# Patient Record
Sex: Female | Born: 1990 | Race: Black or African American | Hispanic: No | Marital: Single | Smoking: Never smoker
Health system: Southern US, Community
[De-identification: ages and names within clinical notes are randomized; demographics above are authoritative.]

---
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
## 2011-01-14 LAB — CBC
HCT: 28.6 % — ABNORMAL LOW (ref 36.0–46.0)
HCT: 34.8 % — ABNORMAL LOW (ref 36.0–46.0)
Hemoglobin: 11.5 g/dL — ABNORMAL LOW (ref 12.0–15.0)
Hemoglobin: 9.4 g/dL — ABNORMAL LOW (ref 12.0–15.0)
MCHC: 32.8 g/dL (ref 30.0–36.0)
MCHC: 33.1 g/dL (ref 30.0–36.0)
MCV: 86.2 fL (ref 78.0–100.0)
MCV: 87.2 fL (ref 78.0–100.0)
Platelets: 196 K/uL (ref 150–400)
Platelets: 224 K/uL (ref 150–400)
RBC: 3.28 MIL/uL — ABNORMAL LOW (ref 3.87–5.11)
RBC: 4.04 MIL/uL (ref 3.87–5.11)
RDW: 15.3 % (ref 11.5–15.5)
RDW: 15.7 % — ABNORMAL HIGH (ref 11.5–15.5)
WBC: 14.8 K/uL — ABNORMAL HIGH (ref 4.0–10.5)
WBC: 8.1 K/uL (ref 4.0–10.5)

## 2011-01-14 LAB — SYPHILIS: RPR W/REFLEX TO RPR TITER AND TREPONEMAL ANTIBODIES, TRADITIONAL SCREENING AND DIAGNOSIS ALGORITHM: RPR Ser Ql: NONREACTIVE

## 2011-01-30 ENCOUNTER — Emergency Department (HOSPITAL_COMMUNITY)
Admission: EM | Admit: 2011-01-30 | Discharge: 2011-01-30 | Disposition: A | Discharge status: Home / Self Care | Payer: Self-pay | Attending: Emergency Medicine | Admitting: Emergency Medicine

## 2011-01-30 DIAGNOSIS — N61 Mastitis without abscess: Secondary | ICD-10-CM | POA: Insufficient documentation

## 2011-03-02 ENCOUNTER — Emergency Department (HOSPITAL_COMMUNITY)
Admission: EM | Admit: 2011-03-02 | Discharge: 2011-03-02 | Disposition: A | Discharge status: Home / Self Care | Payer: Self-pay | Attending: Emergency Medicine | Admitting: Emergency Medicine

## 2011-03-02 DIAGNOSIS — R21 Rash and other nonspecific skin eruption: Secondary | ICD-10-CM | POA: Insufficient documentation

## 2011-12-09 ENCOUNTER — Encounter (HOSPITAL_COMMUNITY): Payer: Self-pay | Admitting: Adult Health

## 2011-12-09 ENCOUNTER — Emergency Department (HOSPITAL_COMMUNITY)
Admission: EM | Admit: 2011-12-09 | Discharge: 2011-12-09 | Disposition: A | Discharge status: Home / Self Care | Payer: Self-pay | Attending: Emergency Medicine | Admitting: Emergency Medicine

## 2011-12-09 DIAGNOSIS — L509 Urticaria, unspecified: Secondary | ICD-10-CM | POA: Insufficient documentation

## 2011-12-09 MED ORDER — DIPHENHYDRAMINE HCL 25 MG PO CAPS: 25.0000 mg | ORAL_CAPSULE | Freq: Four times a day (QID) | ORAL | Status: DC | PRN

## 2011-12-09 MED ORDER — PREDNISONE 20 MG PO TABS
60.0000 mg | ORAL_TABLET | Freq: Once | ORAL | Status: AC
  Administered 2011-12-09: 60 mg via ORAL
  Filled 2011-12-09: qty 3

## 2011-12-09 MED ORDER — FAMOTIDINE 20 MG PO TABS
20.0000 mg | ORAL_TABLET | Freq: Once | ORAL | Status: AC
  Administered 2011-12-09: 20 mg via ORAL
  Filled 2011-12-09: qty 1

## 2011-12-09 MED ORDER — DIPHENHYDRAMINE HCL 25 MG PO CAPS
50.0000 mg | ORAL_CAPSULE | Freq: Once | ORAL | Status: AC
  Administered 2011-12-09: 50 mg via ORAL
  Filled 2011-12-09: qty 2

## 2011-12-09 MED ORDER — FAMOTIDINE 20 MG PO TABS: 20.0000 mg | ORAL_TABLET | Freq: Two times a day (BID) | ORAL | Status: DC

## 2011-12-09 NOTE — ED Notes (Signed)
Urticaria that began this evening noted to hands and trunk bilaterally. C/o itching

## 2011-12-09 NOTE — Discharge Instructions (Signed)
Continue to take benadryl and pepcid as prescribed for reaction. Take a shower when you get home. Follow up if not improving. Return to ER if having swelling to lips, tongue, difficulty breathing.  Allergic Reaction Allergic reactions can be caused by anything your body is sensitive to. Your body may be sensitive to food, medicines, molds, pollens, cockroaches, dust mites, pets, insect stings, and other things around you. An allergic reaction may cause puffiness (swelling), itching, sneezing, coughing, or problems breathing.  Allergies cannot be cured, but they can be controlled with medicine. Some allergies happen only at certain times of the year. Try to stay away from what causes your reaction if possible. Sometimes, it is hard to tell what causes your reaction. HOME CARE If you have a rash or red patches (hives) on your skin:  Take medicines as told by your doctor.   Do not drive or drink alcohol after taking medicines. They can make you sleepy.   Put cold cloths on your skin. Take baths in cool water. This will help your itching. Do not take hot baths or showers. Heat will make the itching worse.   If your allergies get worse, your doctor might give you other medicines. Talk to your doctor if problems continue.  GET HELP RIGHT AWAY IF:   You have trouble breathing.   You have a tight feeling in your chest or throat.   Your mouth gets puffy (swollen).   You have red, itchy patches on your skin (hives) that get worse.   You have itching all over your body.  MAKE SURE YOU:   Understand these instructions.   Will watch your condition.   Will get help right away if you are not doing well or get worse.  Document Released: 09/16/2009 Document Revised: 06/10/2011 Document Reviewed: 09/16/2009 [facility] Patient Information 2012 [place], [place].

## 2011-12-09 NOTE — ED Provider Notes (Signed)
History     CSN: [id number]  Arrival date & time 12/09/11  1814   First MD Initiated Contact with Patient 12/09/11 1831      Chief Complaint  Patient presents with  . Pruritis  . Urticaria    (Consider location/radiation/quality/duration/timing/severity/associated sxs/prior treatment) Patient is a 21 y.o. female presenting with urticaria. The history is provided by the patient.  Urticaria This is a new problem. The current episode started today. Associated symptoms include a rash. Pertinent negatives include no chills or fever.  Pt states she just got to work and began having hives all over the body. Denies lip or throat swelling. Denies SOB. States works at [employer] with lots of chemicals. Unsure what she came in contact with. Denies new soaps, detergent, personal products. Denies new foods. States she did not eat anything today. Did not take any medications.   History reviewed. No pertinent past medical history.  History reviewed. No pertinent past surgical history.  History reviewed. No pertinent family history.  History  Substance Use Topics  . Smoking status: Never Smoker   . Smokeless tobacco: Not on file  . Alcohol Use: No    OB History    Grav Para Term Preterm Abortions TAB SAB Ect Mult Living                  Review of Systems  Constitutional: Negative for fever and chills.  HENT: Negative for facial swelling and trouble swallowing.   Eyes: Negative.   Respiratory: Negative for choking, shortness of breath and stridor.   Cardiovascular: Negative.   Gastrointestinal: Negative.   Musculoskeletal: Negative.   Skin: Positive for rash.  Neurological: Negative.   Psychiatric/Behavioral: Negative.     Allergies  Review of patient's allergies indicates no known allergies.  Home Medications  No current outpatient prescriptions on file.  BP 127/74  Pulse 76  Temp(Src) 98.7 F (37.1 C) (Oral)  Resp 16  SpO2 100%  Physical Exam  Nursing note and  vitals reviewed. Constitutional: She is oriented to person, place, and time. She appears well-developed and well-nourished. No distress.  HENT:  Head: Normocephalic and atraumatic.       Normal lips, tongue, uvula, with no swelling  Eyes: Conjunctivae are normal.  Neck: Neck supple.  Cardiovascular: Normal rate, regular rhythm and normal heart sounds.   Pulmonary/Chest: Effort normal and breath sounds normal. No respiratory distress.  Musculoskeletal: Normal range of motion. She exhibits no edema.  Lymphadenopathy:    She has no cervical adenopathy.  Neurological: She is alert and oriented to person, place, and time.  Skin: Skin is warm and dry.       Generalized hives all over the body, raised, wheals, erythemous.   Psychiatric: She has a normal mood and affect.    ED Course  Procedures (including critical care time)  Generalized hives. No respiratory problems, no swelling of face, lips, tongue, uvula. Will give prednisone here in ED, benadryl, pepcid. Will d/c home. VS normal. She is in NAD.  No diagnosis found.    MDM          Lottie Mussel, PA 12/09/11 [id number]

## 2011-12-10 NOTE — ED Provider Notes (Signed)
Medical screening examination/treatment/procedure(s) were conducted as a shared visit with non-physician practitioner(s) and myself.  I personally evaluated the patient during the encounter    Nelia Shi, MD 12/10/11 1600

## 2011-12-23 ENCOUNTER — Encounter (HOSPITAL_BASED_OUTPATIENT_CLINIC_OR_DEPARTMENT_OTHER): Payer: Self-pay

## 2011-12-23 ENCOUNTER — Emergency Department (INDEPENDENT_AMBULATORY_CARE_PROVIDER_SITE_OTHER): Payer: Self-pay

## 2011-12-23 ENCOUNTER — Emergency Department (HOSPITAL_BASED_OUTPATIENT_CLINIC_OR_DEPARTMENT_OTHER)
Admission: EM | Admit: 2011-12-23 | Discharge: 2011-12-23 | Disposition: A | Discharge status: Home / Self Care | Payer: Self-pay | Attending: Emergency Medicine | Admitting: Emergency Medicine

## 2011-12-23 DIAGNOSIS — M549 Dorsalgia, unspecified: Secondary | ICD-10-CM | POA: Insufficient documentation

## 2011-12-23 DIAGNOSIS — B9689 Other specified bacterial agents as the cause of diseases classified elsewhere: Secondary | ICD-10-CM | POA: Insufficient documentation

## 2011-12-23 DIAGNOSIS — N760 Acute vaginitis: Secondary | ICD-10-CM | POA: Insufficient documentation

## 2011-12-23 DIAGNOSIS — R1031 Right lower quadrant pain: Secondary | ICD-10-CM

## 2011-12-23 DIAGNOSIS — R109 Unspecified abdominal pain: Secondary | ICD-10-CM | POA: Insufficient documentation

## 2011-12-23 DIAGNOSIS — R1110 Vomiting, unspecified: Secondary | ICD-10-CM | POA: Insufficient documentation

## 2011-12-23 DIAGNOSIS — N949 Unspecified condition associated with female genital organs and menstrual cycle: Secondary | ICD-10-CM

## 2011-12-23 DIAGNOSIS — A499 Bacterial infection, unspecified: Secondary | ICD-10-CM | POA: Insufficient documentation

## 2011-12-23 LAB — URINALYSIS, ROUTINE W REFLEX MICROSCOPIC
Hgb urine dipstick: NEGATIVE
Protein, ur: NEGATIVE mg/dL
Urobilinogen, UA: 1 mg/dL (ref 0.0–1.0)

## 2011-12-23 LAB — DIFFERENTIAL
Eosinophils Relative: 1 % (ref 0–5)
Lymphocytes Relative: 13 % (ref 12–46)
Lymphs Abs: 1.8 10*3/uL (ref 0.7–4.0)
Monocytes Absolute: 0.8 10*3/uL (ref 0.1–1.0)
Monocytes Relative: 6 % (ref 3–12)

## 2011-12-23 LAB — WET PREP, GENITAL

## 2011-12-23 LAB — CBC
HCT: 38.5 % (ref 36.0–46.0)
MCV: 90 fL (ref 78.0–100.0)
RBC: 4.28 MIL/uL (ref 3.87–5.11)
RDW: 14.3 % (ref 11.5–15.5)
WBC: 14.4 10*3/uL — ABNORMAL HIGH (ref 4.0–10.5)

## 2011-12-23 LAB — PREGNANCY, URINE: Preg Test, Ur: NEGATIVE

## 2011-12-23 MED ORDER — IOHEXOL 300 MG/ML  SOLN
20.0000 mL | INTRAMUSCULAR | Status: DC | PRN
  Administered 2011-12-23: 20 mL via ORAL

## 2011-12-23 MED ORDER — METRONIDAZOLE 500 MG PO TABS: 500.0000 mg | ORAL_TABLET | Freq: Two times a day (BID) | ORAL | Status: AC

## 2011-12-23 MED ORDER — IOHEXOL 300 MG/ML  SOLN
100.0000 mL | Freq: Once | INTRAMUSCULAR | Status: AC | PRN
  Administered 2011-12-23: 100 mL via INTRAVENOUS

## 2011-12-23 NOTE — ED Provider Notes (Signed)
Medical screening examination/treatment/procedure(s) were performed by non-physician practitioner and as supervising physician I was immediately available for consultation/collaboration.  Colbey Wirtanen K Linker, MD 12/23/11 2207 

## 2011-12-23 NOTE — Discharge Instructions (Signed)
Bacterial Vaginosis Bacterial vaginosis (BV) is a vaginal infection where the normal balance of bacteria in the vagina is disrupted. The normal balance is then replaced by an overgrowth of certain bacteria. There are several different kinds of bacteria that can cause BV. BV is the most common vaginal infection in women of childbearing age. CAUSES   The cause of BV is not fully understood. BV develops when there is an increase or imbalance of harmful bacteria.   Some activities or behaviors can upset the normal balance of bacteria in the vagina and put women at increased risk including:   Having a new sex partner or multiple sex partners.   Douching.   Using an intrauterine device (IUD) for contraception.   It is not clear what role sexual activity plays in the development of BV. However, women that have never had sexual intercourse are rarely infected with BV.  Women do not get BV from toilet seats, bedding, swimming pools or from touching objects around them.  SYMPTOMS   Grey vaginal discharge.   A fish-like odor with discharge, especially after sexual intercourse.   Itching or burning of the vagina and vulva.   Burning or pain with urination.   Some women have no signs or symptoms at all.  DIAGNOSIS  Your caregiver must examine the vagina for signs of BV. Your caregiver will perform lab tests and look at the sample of vaginal fluid through a microscope. They will look for bacteria and abnormal cells (clue cells), a pH test higher than 4.5, and a positive amine test all associated with BV.  RISKS AND COMPLICATIONS   Pelvic inflammatory disease (PID).   Infections following gynecology surgery.   Developing HIV.   Developing herpes virus.  TREATMENT  Sometimes BV will clear up without treatment. However, all women with symptoms of BV should be treated to avoid complications, especially if gynecology surgery is planned. Female partners generally do not need to be treated. However,  BV may spread between female sex partners so treatment is helpful in preventing a recurrence of BV.   BV may be treated with antibiotics. The antibiotics come in either pill or vaginal cream forms. Either can be used with nonpregnant or pregnant women, but the recommended dosages differ. These antibiotics are not harmful to the baby.   BV can recur after treatment. If this happens, a second round of antibiotics will often be prescribed.   Treatment is important for pregnant women. If not treated, BV can cause a premature delivery, especially for a pregnant woman who had a premature birth in the past. All pregnant women who have symptoms of BV should be checked and treated.   For chronic reoccurrence of BV, treatment with a type of prescribed gel vaginally twice a week is helpful.  HOME CARE INSTRUCTIONS   Finish all medication as directed by your caregiver.   Do not have sex until treatment is completed.   Tell your sexual partner that you have a vaginal infection. They should see their caregiver and be treated if they have problems, such as a mild rash or itching.   Practice safe sex. Use condoms. Only have 1 sex partner.  PREVENTION  Basic prevention steps can help reduce the risk of upsetting the natural balance of bacteria in the vagina and developing BV:  Do not have sexual intercourse (be abstinent).   Do not douche.   Use all of the medicine prescribed for treatment of BV, even if the signs and symptoms go away.     Tell your sex partner if you have BV. That way, they can be treated, if needed, to prevent reoccurrence.  SEEK MEDICAL CARE IF:   Your symptoms are not improving after 3 days of treatment.   You have increased discharge, pain, or fever.  MAKE SURE YOU:   Understand these instructions.   Will watch your condition.   Will get help right away if you are not doing well or get worse.  FOR MORE INFORMATION  Division of STD Prevention (DSTDP), Centers for Disease  Control and Prevention: www.cdc.gov/std American Social Health Association (ASHA): www.ashastd.org  Document Released: 09/28/2005 Document Revised: 09/17/2011 Document Reviewed: 03/21/2009 [employer] Patient Information 2012 [employer]. 

## 2011-12-23 NOTE — ED Notes (Signed)
C/o lower abd pain/pelvic pain after intercourse 1 hour PTA-vomited x 1 after arrival to ED

## 2011-12-23 NOTE — ED Provider Notes (Signed)
History     CSN: [id number]  Arrival date & time 12/23/11  1516   First MD Initiated Contact with Patient 12/23/11 1731      Chief Complaint  Patient presents with  . Abdominal Pain  . Emesis    (Consider location/radiation/quality/duration/timing/severity/associated sxs/prior treatment) Patient is a 21 y.o. female presenting with abdominal pain. The history is provided by the patient. No language interpreter was used.  Abdominal Pain The primary symptoms of the illness include abdominal pain. The current episode started 3 to 5 hours ago. The onset of the illness was gradual. The problem has not changed since onset. Associated with: vomitting once. The patient states that she believes she is currently not pregnant. The patient has not had a change in bowel habit. Additional symptoms associated with the illness include back pain. Significant associated medical issues do not include PUD or diabetes.  Pt complains of lower abdominal pain.  Pt reports she has no std risk.  Pt denies burning with urination  History reviewed. No pertinent past medical history.  History reviewed. No pertinent past surgical history.  No family history on file.  History  Substance Use Topics  . Smoking status: Never Smoker   . Smokeless tobacco: Not on file  . Alcohol Use: No    OB History    Grav Para Term Preterm Abortions TAB SAB Ect Mult Living                  Review of Systems  Gastrointestinal: Positive for abdominal pain.  Musculoskeletal: Positive for back pain.  All other systems reviewed and are negative.    Allergies  Review of patient's allergies indicates no known allergies.  Home Medications   Current Outpatient Rx  Name Route Sig Dispense Refill  . BENADRYL ALLERGY PO Oral Take 1 tablet by mouth daily as needed. Patient used this medication because she broke out in hives.    Marland Kitchen FAMOTIDINE 20 MG PO TABS Oral Take 1 tablet (20 mg total) by mouth 2 (two) times daily. 30  tablet 0    BP 130/89  Pulse 65  Temp(Src) 98 F (36.7 C) (Oral)  Resp 16  Ht 5\' 2"  (1.575 m)  Wt 128 lb (58.06 kg)  BMI 23.41 kg/m2  SpO2 100%  Physical Exam  Nursing note and vitals reviewed. Constitutional: She appears well-developed and well-nourished.  HENT:  Head: Normocephalic and atraumatic.  Right Ear: External ear normal.  Left Ear: External ear normal.  Eyes: Conjunctivae and EOM are normal. Pupils are equal, round, and reactive to light.  Neck: Normal range of motion. Neck supple.  Cardiovascular: Normal rate and normal heart sounds.   Pulmonary/Chest: Effort normal and breath sounds normal.  Abdominal: Soft. There is tenderness.  Genitourinary: Vaginal discharge found.  Musculoskeletal: Normal range of motion.  Neurological: She is alert.  Skin: Skin is warm.  Psychiatric: She has a normal mood and affect.    ED Course  Procedures (including critical care time)  Labs Reviewed  URINALYSIS, ROUTINE W REFLEX MICROSCOPIC - Abnormal; Notable for the following:    APPearance TURBID (*)    All other components within normal limits  CBC - Abnormal; Notable for the following:    WBC 14.4 (*)    All other components within normal limits  DIFFERENTIAL - Abnormal; Notable for the following:    Neutrophils Relative 81 (*)    Neutro Abs 11.6 (*)    All other components within normal limits  WET PREP, GENITAL -  Abnormal; Notable for the following:    Clue Cells Wet Prep HPF POC FEW (*)    WBC, Wet Prep HPF POC FEW (*)    All other components within normal limits  PREGNANCY, URINE  URINE MICROSCOPIC-ADD ON  GC/CHLAMYDIA PROBE AMP, GENITAL   Ct Abdomen Pelvis W Contrast  12/23/2011  *RADIOLOGY REPORT*  Clinical Data: Low pelvic pain  CT ABDOMEN AND PELVIS WITH CONTRAST  Technique:  Multidetector CT imaging of the abdomen and pelvis was performed following the standard protocol during bolus administration of intravenous contrast.  Contrast: 20mL OMNIPAQUE IOHEXOL 300  MG/ML IJ SOLN, OMNIPAQUE IOHEXOL 300 MG/ML IJ SOLN  Comparison: None.  Findings: Visualized lung bases clear.  Unremarkable liver, gallbladder, spleen, adrenal glands, kidneys, pancreas.  Stomach is physiologically distended.  Small bowel nondilated.  Normal appendix.  The colon is nondistended, unremarkable.  Urinary bladder physiologically distended.  IUD projects in expected location.  No ascites.  No free air.  Portal vein patent.  No adenopathy localized.  Lumbar spine intact.  IMPRESSION:  1.  No acute abdominal process.  Original Report Authenticated By: Osa Craver, M.D.     No diagnosis found.    MDM  Pt has clue cells,   I will treat with flagyl.  Pt advised to see her Md for recheck.  Pt may be developing viral illness.  Ct scan is normal    Results for orders placed during the hospital encounter of 12/23/11  URINALYSIS, ROUTINE W REFLEX MICROSCOPIC      Component Value Range   Color, Urine YELLOW  YELLOW    APPearance TURBID (*) CLEAR    Specific Gravity, Urine 1.025  1.005 - 1.030    pH 7.5  5.0 - 8.0    Glucose, UA NEGATIVE  NEGATIVE (mg/dL)   Hgb urine dipstick NEGATIVE  NEGATIVE    Bilirubin Urine NEGATIVE  NEGATIVE    Ketones, ur NEGATIVE  NEGATIVE (mg/dL)   Protein, ur NEGATIVE  NEGATIVE (mg/dL)   Urobilinogen, UA 1.0  0.0 - 1.0 (mg/dL)   Nitrite NEGATIVE  NEGATIVE    Leukocytes, UA NEGATIVE  NEGATIVE   PREGNANCY, URINE      Component Value Range   Preg Test, Ur NEGATIVE  NEGATIVE   URINE MICROSCOPIC-ADD ON      Component Value Range   Squamous Epithelial / LPF RARE  RARE    WBC, UA 0-2  <3 (WBC/hpf)   Urine-Other AMORPHOUS URATES/PHOSPHATES    CBC      Component Value Range   WBC 14.4 (*) 4.0 - 10.5 (K/uL)   RBC 4.28  3.87 - 5.11 (MIL/uL)   Hemoglobin 13.3  12.0 - 15.0 (g/dL)   HCT 16.1  09.6 - 04.5 (%)   MCV 90.0  78.0 - 100.0 (fL)   MCH 31.1  26.0 - 34.0 (pg)   MCHC 34.5  30.0 - 36.0 (g/dL)   RDW 40.9  81.1 - 91.4 (%)   Platelets 258   150 - 400 (K/uL)  DIFFERENTIAL      Component Value Range   Neutrophils Relative 81 (*) 43 - 77 (%)   Neutro Abs 11.6 (*) 1.7 - 7.7 (K/uL)   Lymphocytes Relative 13  12 - 46 (%)   Lymphs Abs 1.8  0.7 - 4.0 (K/uL)   Monocytes Relative 6  3 - 12 (%)   Monocytes Absolute 0.8  0.1 - 1.0 (K/uL)   Eosinophils Relative 1  0 - 5 (%)  Eosinophils Absolute 0.1  0.0 - 0.7 (K/uL)   Basophils Relative 0  0 - 1 (%)   Basophils Absolute 0.0  0.0 - 0.1 (K/uL)  WET PREP, GENITAL      Component Value Range   Yeast Wet Prep HPF POC NONE SEEN  NONE SEEN    Trich, Wet Prep NONE SEEN  NONE SEEN    Clue Cells Wet Prep HPF POC FEW (*) NONE SEEN    WBC, Wet Prep HPF POC FEW (*) NONE SEEN    Ct Abdomen Pelvis W Contrast  12/23/2011  *RADIOLOGY REPORT*  Clinical Data: Low pelvic pain  CT ABDOMEN AND PELVIS WITH CONTRAST  Technique:  Multidetector CT imaging of the abdomen and pelvis was performed following the standard protocol during bolus administration of intravenous contrast.  Contrast: 20mL OMNIPAQUE IOHEXOL 300 MG/ML IJ SOLN, OMNIPAQUE IOHEXOL 300 MG/ML IJ SOLN  Comparison: None.  Findings: Visualized lung bases clear.  Unremarkable liver, gallbladder, spleen, adrenal glands, kidneys, pancreas.  Stomach is physiologically distended.  Small bowel nondilated.  Normal appendix.  The colon is nondistended, unremarkable.  Urinary bladder physiologically distended.  IUD projects in expected location.  No ascites.  No free air.  Portal vein patent.  No adenopathy localized.  Lumbar spine intact.  IMPRESSION:  1.  No acute abdominal process.  Original Report Authenticated By: Osa Craver, M.D.      Lonia Skinner [place], [place] 12/23/11 2153  Lonia Skinner [place], [place] 12/23/11 2154

## 2011-12-24 LAB — GC/CHLAMYDIA PROBE AMP, GENITAL
Chlamydia, DNA Probe: NEGATIVE
GC Probe Amp, Genital: NEGATIVE

## 2019-05-30 ENCOUNTER — Other Ambulatory Visit: Payer: Self-pay

## 2019-05-30 DIAGNOSIS — Z20822 Contact with and (suspected) exposure to covid-19: Secondary | ICD-10-CM

## 2019-05-31 LAB — NOVEL CORONAVIRUS, NAA: SARS-CoV-2, NAA: NOT DETECTED

## 2020-02-27 ENCOUNTER — Emergency Department (HOSPITAL_COMMUNITY)
Admission: EM | Admit: 2020-02-27 | Discharge: 2020-02-27 | Disposition: A | Discharge status: Home / Self Care | Payer: Self-pay | Attending: Emergency Medicine | Admitting: Emergency Medicine

## 2020-02-27 ENCOUNTER — Encounter (HOSPITAL_COMMUNITY): Payer: Self-pay

## 2020-02-27 ENCOUNTER — Other Ambulatory Visit: Payer: Self-pay

## 2020-02-27 DIAGNOSIS — Z5321 Procedure and treatment not carried out due to patient leaving prior to being seen by health care provider: Secondary | ICD-10-CM | POA: Insufficient documentation

## 2020-02-27 DIAGNOSIS — K625 Hemorrhage of anus and rectum: Secondary | ICD-10-CM | POA: Insufficient documentation

## 2020-02-27 LAB — ABO/RH: ABO/RH(D): O POS

## 2020-02-27 LAB — CBC WITH DIFFERENTIAL/PLATELET
Abs Immature Granulocytes: 0.08 10*3/uL — ABNORMAL HIGH (ref 0.00–0.07)
Basophils Absolute: 0.1 10*3/uL (ref 0.0–0.1)
Basophils Relative: 0 %
Eosinophils Absolute: 0.1 10*3/uL (ref 0.0–0.5)
Eosinophils Relative: 1 %
HCT: 38.4 % (ref 36.0–46.0)
Hemoglobin: 12.9 g/dL (ref 12.0–15.0)
Immature Granulocytes: 1 %
Lymphocytes Relative: 10 %
Lymphs Abs: 1.6 10*3/uL (ref 0.7–4.0)
MCH: 32.4 pg (ref 26.0–34.0)
MCHC: 33.6 g/dL (ref 30.0–36.0)
MCV: 96.5 fL (ref 80.0–100.0)
Monocytes Absolute: 1.2 10*3/uL — ABNORMAL HIGH (ref 0.1–1.0)
Monocytes Relative: 7 %
Neutro Abs: 14 10*3/uL — ABNORMAL HIGH (ref 1.7–7.7)
Neutrophils Relative %: 81 %
Platelets: 347 10*3/uL (ref 150–400)
RBC: 3.98 MIL/uL (ref 3.87–5.11)
RDW: 12.8 % (ref 11.5–15.5)
WBC: 17 10*3/uL — ABNORMAL HIGH (ref 4.0–10.5)
nRBC: 0 % (ref 0.0–0.2)

## 2020-02-27 LAB — COMPREHENSIVE METABOLIC PANEL
ALT: 19 U/L (ref 0–44)
AST: 17 U/L (ref 15–41)
Albumin: 3.8 g/dL (ref 3.5–5.0)
Alkaline Phosphatase: 52 U/L (ref 38–126)
Anion gap: 11 (ref 5–15)
BUN: 6 mg/dL (ref 6–20)
CO2: 22 mmol/L (ref 22–32)
Calcium: 9.1 mg/dL (ref 8.9–10.3)
Chloride: 104 mmol/L (ref 98–111)
Creatinine, Ser: 0.67 mg/dL (ref 0.44–1.00)
GFR calc Af Amer: >60 mL/min (ref >60)
GFR calc non Af Amer: >60 mL/min (ref >60)
Glucose, Bld: 101 mg/dL — ABNORMAL HIGH (ref 70–99)
Potassium: 3.8 mmol/L (ref 3.5–5.1)
Sodium: 137 mmol/L (ref 135–145)
Total Bilirubin: 0.6 mg/dL (ref 0.3–1.2)
Total Protein: 7.2 g/dL (ref 6.5–8.1)

## 2020-02-27 LAB — TYPE AND SCREEN
ABO/RH(D): O POS
Antibody Screen: NEGATIVE

## 2020-02-27 LAB — URINALYSIS, ROUTINE W REFLEX MICROSCOPIC
Bilirubin Urine: NEGATIVE
Glucose, UA: NEGATIVE mg/dL
Ketones, ur: 5 mg/dL — AB
Nitrite: NEGATIVE
Protein, ur: NEGATIVE mg/dL
Specific Gravity, Urine: 1.016 (ref 1.005–1.030)
pH: 5 (ref 5.0–8.0)

## 2020-02-27 LAB — I-STAT BETA HCG BLOOD, ED (MC, WL, AP ONLY): I-stat hCG, quantitative: <5 m[IU]/mL (ref <5)

## 2020-02-27 LAB — LIPASE, BLOOD: Lipase: 17 U/L (ref 11–51)

## 2020-02-27 NOTE — ED Triage Notes (Signed)
Pt arrives to ED w/ c/o 6/10 abdominal pain and bright red blood in stool. Pt denies n/v.

## 2020-02-27 NOTE — ED Notes (Signed)
Pt stated that she was leaving  

## 2020-02-28 ENCOUNTER — Other Ambulatory Visit: Payer: Self-pay

## 2020-02-28 ENCOUNTER — Encounter (HOSPITAL_BASED_OUTPATIENT_CLINIC_OR_DEPARTMENT_OTHER): Payer: Self-pay | Admitting: *Deleted

## 2020-02-28 ENCOUNTER — Inpatient Hospital Stay (HOSPITAL_BASED_OUTPATIENT_CLINIC_OR_DEPARTMENT_OTHER)
Admission: EM | Admit: 2020-02-28 | Discharge: 2020-03-02 | DRG: 392 | Discharge status: Against Medical Advice | Payer: Self-pay | Attending: Internal Medicine | Admitting: Internal Medicine

## 2020-02-28 DIAGNOSIS — N179 Acute kidney failure, unspecified: Secondary | ICD-10-CM | POA: Diagnosis present

## 2020-02-28 DIAGNOSIS — E860 Dehydration: Secondary | ICD-10-CM | POA: Diagnosis present

## 2020-02-28 DIAGNOSIS — K529 Noninfective gastroenteritis and colitis, unspecified: Principal | ICD-10-CM | POA: Diagnosis present

## 2020-02-28 DIAGNOSIS — Z79899 Other long term (current) drug therapy: Secondary | ICD-10-CM

## 2020-02-28 DIAGNOSIS — R21 Rash and other nonspecific skin eruption: Secondary | ICD-10-CM | POA: Diagnosis present

## 2020-02-28 DIAGNOSIS — Z20822 Contact with and (suspected) exposure to covid-19: Secondary | ICD-10-CM | POA: Diagnosis present

## 2020-02-28 LAB — CBC WITH DIFFERENTIAL/PLATELET
Abs Immature Granulocytes: 0.08 10*3/uL — ABNORMAL HIGH (ref 0.00–0.07)
Basophils Absolute: 0 10*3/uL (ref 0.0–0.1)
Basophils Relative: 0 %
Eosinophils Absolute: 0.2 10*3/uL (ref 0.0–0.5)
Eosinophils Relative: 2 %
HCT: 38.2 % (ref 36.0–46.0)
Hemoglobin: 13.2 g/dL (ref 12.0–15.0)
Immature Granulocytes: 1 %
Lymphocytes Relative: 22 %
Lymphs Abs: 2.8 10*3/uL (ref 0.7–4.0)
MCH: 33 pg (ref 26.0–34.0)
MCHC: 34.6 g/dL (ref 30.0–36.0)
MCV: 95.5 fL (ref 80.0–100.0)
Monocytes Absolute: 1.1 10*3/uL — ABNORMAL HIGH (ref 0.1–1.0)
Monocytes Relative: 9 %
Neutro Abs: 8.6 10*3/uL — ABNORMAL HIGH (ref 1.7–7.7)
Neutrophils Relative %: 66 %
Platelets: 322 10*3/uL (ref 150–400)
RBC: 4 MIL/uL (ref 3.87–5.11)
RDW: 13 % (ref 11.5–15.5)
WBC: 12.8 10*3/uL — ABNORMAL HIGH (ref 4.0–10.5)
nRBC: 0 % (ref 0.0–0.2)

## 2020-02-28 LAB — COMPREHENSIVE METABOLIC PANEL
ALT: 17 U/L (ref 0–44)
AST: 15 U/L (ref 15–41)
Albumin: 4 g/dL (ref 3.5–5.0)
Alkaline Phosphatase: 50 U/L (ref 38–126)
Anion gap: 9 (ref 5–15)
BUN: 10 mg/dL (ref 6–20)
CO2: 24 mmol/L (ref 22–32)
Calcium: 9.2 mg/dL (ref 8.9–10.3)
Chloride: 103 mmol/L (ref 98–111)
Creatinine, Ser: 1.11 mg/dL — ABNORMAL HIGH (ref 0.44–1.00)
GFR calc Af Amer: >60 mL/min (ref >60)
GFR calc non Af Amer: >60 mL/min (ref >60)
Glucose, Bld: 99 mg/dL (ref 70–99)
Potassium: 3.8 mmol/L (ref 3.5–5.1)
Sodium: 136 mmol/L (ref 135–145)
Total Bilirubin: 0.4 mg/dL (ref 0.3–1.2)
Total Protein: 7.6 g/dL (ref 6.5–8.1)

## 2020-02-28 LAB — LIPASE, BLOOD: Lipase: 21 U/L (ref 11–51)

## 2020-02-28 NOTE — ED Triage Notes (Addendum)
Pt c/o lower abd/ pelvic pain x 1 week, seen at [facility] ED  yesterday for same , LWBS but labs and urine done, c/o rectal discomfort with BM and blood noted, pt denies Hemorid

## 2020-02-28 NOTE — ED Notes (Addendum)
Md aware for pt , new orders to repeat labs

## 2020-02-29 ENCOUNTER — Emergency Department (HOSPITAL_BASED_OUTPATIENT_CLINIC_OR_DEPARTMENT_OTHER): Payer: Self-pay

## 2020-02-29 DIAGNOSIS — E860 Dehydration: Secondary | ICD-10-CM

## 2020-02-29 DIAGNOSIS — K529 Noninfective gastroenteritis and colitis, unspecified: Principal | ICD-10-CM

## 2020-02-29 DIAGNOSIS — N179 Acute kidney failure, unspecified: Secondary | ICD-10-CM

## 2020-02-29 LAB — CBC
HCT: 38.3 % (ref 36.0–46.0)
Hemoglobin: 12.6 g/dL (ref 12.0–15.0)
MCH: 31.8 pg (ref 26.0–34.0)
MCHC: 32.9 g/dL (ref 30.0–36.0)
MCV: 96.7 fL (ref 80.0–100.0)
Platelets: 367 10*3/uL (ref 150–400)
RBC: 3.96 MIL/uL (ref 3.87–5.11)
RDW: 12.9 % (ref 11.5–15.5)
WBC: 11.3 10*3/uL — ABNORMAL HIGH (ref 4.0–10.5)
nRBC: 0 % (ref 0.0–0.2)

## 2020-02-29 LAB — URINALYSIS, ROUTINE W REFLEX MICROSCOPIC
Glucose, UA: NEGATIVE mg/dL
Ketones, ur: 40 mg/dL — AB
Nitrite: NEGATIVE
Protein, ur: NEGATIVE mg/dL
Specific Gravity, Urine: >1.03 — ABNORMAL HIGH (ref 1.005–1.030)
pH: 6 (ref 5.0–8.0)

## 2020-02-29 LAB — BASIC METABOLIC PANEL
Anion gap: 11 (ref 5–15)
BUN: 7 mg/dL (ref 6–20)
CO2: 22 mmol/L (ref 22–32)
Calcium: 8.8 mg/dL — ABNORMAL LOW (ref 8.9–10.3)
Chloride: 104 mmol/L (ref 98–111)
Creatinine, Ser: 0.53 mg/dL (ref 0.44–1.00)
GFR calc Af Amer: >60 mL/min (ref >60)
GFR calc non Af Amer: >60 mL/min (ref >60)
Glucose, Bld: 97 mg/dL (ref 70–99)
Potassium: 3.9 mmol/L (ref 3.5–5.1)
Sodium: 137 mmol/L (ref 135–145)

## 2020-02-29 LAB — URINALYSIS, MICROSCOPIC (REFLEX)

## 2020-02-29 LAB — PREGNANCY, URINE: Preg Test, Ur: NEGATIVE

## 2020-02-29 LAB — SARS CORONAVIRUS 2 BY RT PCR (HOSPITAL ORDER, PERFORMED IN ~~LOC~~ HOSPITAL LAB): SARS Coronavirus 2: NEGATIVE

## 2020-02-29 LAB — HIV ANTIBODY (ROUTINE TESTING W REFLEX): HIV Screen 4th Generation wRfx: NONREACTIVE

## 2020-02-29 MED ORDER — DIPHENHYDRAMINE HCL 25 MG PO CAPS
25.0000 mg | ORAL_CAPSULE | Freq: Four times a day (QID) | ORAL | Status: DC | PRN
  Administered 2020-03-01 (×2): 25 mg via ORAL
  Filled 2020-02-29 (×2): qty 1

## 2020-02-29 MED ORDER — PIPERACILLIN-TAZOBACTAM 3.375 G IVPB
3.3750 g | Freq: Three times a day (TID) | INTRAVENOUS | Status: DC
  Administered 2020-02-29 – 2020-03-02 (×7): 3.375 g via INTRAVENOUS
  Filled 2020-02-29 (×8): qty 50

## 2020-02-29 MED ORDER — DEXTROSE IN LACTATED RINGERS 5 % IV SOLN: INTRAVENOUS | Status: DC

## 2020-02-29 MED ORDER — MORPHINE SULFATE (PF) 2 MG/ML IV SOLN
1.0000 mg | INTRAVENOUS | Status: DC | PRN
  Administered 2020-02-29 – 2020-03-01 (×3): 1 mg via INTRAVENOUS
  Filled 2020-02-29 (×3): qty 1

## 2020-02-29 MED ORDER — ONDANSETRON HCL 4 MG/2ML IJ SOLN: 4.0000 mg | Freq: Four times a day (QID) | INTRAMUSCULAR | Status: DC | PRN

## 2020-02-29 MED ORDER — PANTOPRAZOLE SODIUM 40 MG PO TBEC
40.0000 mg | DELAYED_RELEASE_TABLET | Freq: Every day | ORAL | Status: DC
  Administered 2020-02-29 – 2020-03-01 (×2): 40 mg via ORAL
  Filled 2020-02-29 (×2): qty 1

## 2020-02-29 MED ORDER — POLYETHYLENE GLYCOL 3350 17 G PO PACK
17.0000 g | PACK | Freq: Every day | ORAL | Status: DC
  Administered 2020-03-01: 17 g via ORAL

## 2020-02-29 MED ORDER — ONDANSETRON HCL 4 MG PO TABS: 4.0000 mg | ORAL_TABLET | Freq: Four times a day (QID) | ORAL | Status: DC | PRN

## 2020-02-29 MED ORDER — IOHEXOL 300 MG/ML  SOLN
100.0000 mL | Freq: Once | INTRAMUSCULAR | Status: AC
  Administered 2020-02-29: 100 mL via INTRAVENOUS

## 2020-02-29 MED ORDER — MINERAL OIL RE ENEM: 1.0000 | ENEMA | Freq: Once | RECTAL | Status: DC

## 2020-02-29 MED ORDER — ENOXAPARIN SODIUM 40 MG/0.4ML ~~LOC~~ SOLN
40.0000 mg | SUBCUTANEOUS | Status: DC
  Administered 2020-02-29 – 2020-03-01 (×2): 40 mg via SUBCUTANEOUS
  Filled 2020-02-29 (×2): qty 0.4

## 2020-02-29 NOTE — ED Provider Notes (Signed)
[facility] Provider Note: Loretta Dell, MD, FACEP  CSN: [id number] MRN: [id number] ARRIVAL: 02/28/20 at 2032 ROOM: [facility]05/[facility]05   CHIEF COMPLAINT  Abdominal Pain   HISTORY OF PRESENT ILLNESS  02/29/20 12:16 AM Loretta Larson is a 29 y.o. female who complains of sharp pelvic pain into her back when she moves her bowels.  It has been present for 1 day (triage notes indicate 1 week but she tells me 1 day).  She has subsequently developed diarrhea but the pain preceded the diarrhea.  She rates the pain is a 4 out of 10.  She has had no associated nausea, vomiting, dysuria or hematuria.  She is currently on her menses but having no abnormal symptoms and states this pain is not like usual menstrual cramps.   History reviewed. No pertinent past medical history.  History reviewed. No pertinent surgical history.  History reviewed. No pertinent family history.  Social History   Tobacco Use  . Smoking status: Never Smoker  Substance Use Topics  . Alcohol use: No  . Drug use: No    Prior to Admission medications   Medication Sig Start Date End Date Taking? Authorizing Provider  diphenhydrAMINE (BENADRYL) 25 mg capsule Take 1 capsule (25 mg total) by mouth every 6 (six) hours as needed for itching. 12/09/11 12/19/11  Kirichenko, Lemont Fillers, PA-C  DiphenhydrAMINE HCl (BENADRYL ALLERGY PO) Take 1 tablet by mouth daily as needed. Patient used this medication because she broke out in hives.    [provider]  famotidine (PEPCID) 20 MG tablet Take 1 tablet (20 mg total) by mouth 2 (two) times daily. 12/09/11 12/08/12  Jaynie Crumble, PA-C    Allergies Patient has no known allergies.   REVIEW OF SYSTEMS  Negative except as noted here or in the History of Present Illness.   PHYSICAL EXAMINATION  Initial Vital Signs Blood pressure 124/82, pulse 94, temperature 99.4 F (37.4 C), temperature source Oral, resp. rate 16, height 5' (1.524 m), weight 73 kg, SpO2 99  %.  Examination General: Well-developed, well-nourished female in no acute distress; appearance consistent with age of record HENT: normocephalic; atraumatic Eyes: pupils equal, round and reactive to light; extraocular muscles intact Neck: supple Heart: regular rate and rhythm Lungs: clear to auscultation bilaterally Abdomen: soft; nondistended; lower abdominal tenderness; no masses or hepatosplenomegaly; bowel sounds present Extremities: No deformity; full range of motion; pulses normal Neurologic: Awake, alert and oriented; motor function intact in all extremities and symmetric; no facial droop Skin: Warm and dry Psychiatric: Normal mood and affect   RESULTS  Summary of this visit's results, reviewed and interpreted by myself:   EKG Interpretation  Date/Time:    Ventricular Rate:    PR Interval:    QRS Duration:   QT Interval:    QTC Calculation:   R Axis:     Text Interpretation:        Laboratory Studies: Results for orders placed or performed during the hospital encounter of 02/28/20 (from the past 24 hour(s))  Urinalysis, Routine w reflex microscopic     Status: Abnormal   Collection Time: 02/28/20  9:52 PM  Result Value Ref Range   Color, Urine YELLOW YELLOW   APPearance CLEAR CLEAR   Specific Gravity, Urine >1.030 (H) 1.005 - 1.030   pH 6.0 5.0 - 8.0   Glucose, UA NEGATIVE NEGATIVE mg/dL   Hgb urine dipstick MODERATE (A) NEGATIVE   Bilirubin Urine SMALL (A) NEGATIVE   Ketones, ur 40 (A) NEGATIVE mg/dL  Protein, ur NEGATIVE NEGATIVE mg/dL   Nitrite NEGATIVE NEGATIVE   Leukocytes,Ua TRACE (A) NEGATIVE  Pregnancy, urine     Status: None   Collection Time: 02/28/20  9:52 PM  Result Value Ref Range   Preg Test, Ur NEGATIVE NEGATIVE  CBC with Differential     Status: Abnormal   Collection Time: 02/28/20  9:52 PM  Result Value Ref Range   WBC 12.8 (H) 4.0 - 10.5 K/uL   RBC 4.00 3.87 - 5.11 MIL/uL   Hemoglobin 13.2 12.0 - 15.0 g/dL   HCT 38.2 36.0 - 46.0 %    MCV 95.5 80.0 - 100.0 fL   MCH 33.0 26.0 - 34.0 pg   MCHC 34.6 30.0 - 36.0 g/dL   RDW 13.0 11.5 - 15.5 %   Platelets 322 150 - 400 K/uL   nRBC 0.0 0.0 - 0.2 %   Neutrophils Relative % 66 %   Neutro Abs 8.6 (H) 1.7 - 7.7 K/uL   Lymphocytes Relative 22 %   Lymphs Abs 2.8 0.7 - 4.0 K/uL   Monocytes Relative 9 %   Monocytes Absolute 1.1 (H) 0.1 - 1.0 K/uL   Eosinophils Relative 2 %   Eosinophils Absolute 0.2 0.0 - 0.5 K/uL   Basophils Relative 0 %   Basophils Absolute 0.0 0.0 - 0.1 K/uL   Immature Granulocytes 1 %   Abs Immature Granulocytes 0.08 (H) 0.00 - 0.07 K/uL  Comprehensive metabolic panel     Status: Abnormal   Collection Time: 02/28/20  9:52 PM  Result Value Ref Range   Sodium 136 135 - 145 mmol/L   Potassium 3.8 3.5 - 5.1 mmol/L   Chloride 103 98 - 111 mmol/L   CO2 24 22 - 32 mmol/L   Glucose, Bld 99 70 - 99 mg/dL   BUN 10 6 - 20 mg/dL   Creatinine, Ser 1.11 (H) 0.44 - 1.00 mg/dL   Calcium 9.2 8.9 - 10.3 mg/dL   Total Protein 7.6 6.5 - 8.1 g/dL   Albumin 4.0 3.5 - 5.0 g/dL   AST 15 15 - 41 U/L   ALT 17 0 - 44 U/L   Alkaline Phosphatase 50 38 - 126 U/L   Total Bilirubin 0.4 0.3 - 1.2 mg/dL   GFR calc non Af Amer >60 >60 mL/min   GFR calc Af Amer >60 >60 mL/min   Anion gap 9 5 - 15  Lipase, blood     Status: None   Collection Time: 02/28/20  9:52 PM  Result Value Ref Range   Lipase 21 11 - 51 U/L  Urinalysis, Microscopic (reflex)     Status: Abnormal   Collection Time: 02/28/20  9:52 PM  Result Value Ref Range   RBC / HPF 6-10 0 - 5 RBC/hpf   WBC, UA 0-5 0 - 5 WBC/hpf   Bacteria, UA RARE (A) NONE SEEN   Squamous Epithelial / LPF 0-5 0 - 5   Mucus PRESENT    Imaging Studies: CT ABDOMEN PELVIS W CONTRAST  Result Date: 02/29/2020 CLINICAL DATA:  Lower abdominal pain and diarrhea. Pelvic pain. EXAM: CT ABDOMEN AND PELVIS WITH CONTRAST TECHNIQUE: Multidetector CT imaging of the abdomen and pelvis was performed using the standard protocol following bolus  administration of intravenous contrast. CONTRAST:  174mL OMNIPAQUE IOHEXOL 300 MG/ML  SOLN COMPARISON:  Feb 22, 2012. FINDINGS: Lower chest: The lung bases are clear. The heart size is normal. Hepatobiliary: The liver is normal. Normal gallbladder.There is no biliary ductal dilation. Pancreas:  Normal contours without ductal dilatation. No peripancreatic fluid collection. Spleen: Unremarkable. Adrenals/Urinary Tract: --Adrenal glands: Unremarkable. --Right kidney/ureter: No hydronephrosis or radiopaque kidney stones. --Left kidney/ureter: No hydronephrosis or radiopaque kidney stones. --Urinary bladder: Unremarkable. Stomach/Bowel: --Stomach/Duodenum: No hiatal hernia or other gastric abnormality. Normal duodenal course and caliber. --Small bowel: Unremarkable. --Colon: There is diffuse sigmoid and rectal wall thickening with adjacent inflammatory changes and free fluid in the patient's pelvis. There is some mild wall thickening of the transverse colon and descending colon. There is a moderate amount of stool in the ascending colon. --Appendix: Normal. Vascular/Lymphatic: Normal course and caliber of the major abdominal vessels. --No retroperitoneal lymphadenopathy. --No mesenteric lymphadenopathy. --No pelvic or inguinal lymphadenopathy. Reproductive: There is a 3.7 cm fibroid arising uterus. There is an IUD in place. The IUD appears to be somewhat malpositioned and low riding in the endometrial canal. Other: There is a small volume of likely reactive free fluid in the patient's pelvis. The abdominal wall is normal. Musculoskeletal. No acute displaced fractures. IMPRESSION: 1. Diffuse sigmoid and rectal wall thickening with adjacent inflammatory changes and free fluid in the patient's pelvis, consistent with infectious or inflammatory colitis. 2. An IUD is noted and appears to be somewhat low riding in the endometrial canal. This can be further evaluated with a nonemergent outpatient ultrasound. 3. Fibroid uterus.  4. Normal appendix. Electronically Signed   By: Katherine Mantle M.D.   On: 02/29/2020 01:16    ED COURSE and MDM  Nursing notes, initial and subsequent vitals signs, including pulse oximetry, reviewed and interpreted by myself.  Vitals:   02/28/20 2047 02/28/20 2048 02/29/20 0011  BP: (!) 172/62  124/82  Pulse: (!) 106  94  Resp: 18  16  Temp: 99.4 F (37.4 C)    TempSrc: Oral    SpO2: 97%  99%  Weight:  73 kg   Height:  5' (1.524 m)    Medications  iohexol (OMNIPAQUE) 300 MG/ML solution 100 mL (100 mLs Intravenous Contrast Given 02/29/20 0053)   2:33 AM Dr. Antionette Char accepts for admission to the hospitalist service.   PROCEDURES  Procedures   ED DIAGNOSES     ICD-10-CM   1. Colitis  K52.9        Mikayla Chiusano, Jonny Ruiz, MD 02/29/20 [phone]

## 2020-02-29 NOTE — H&P (Signed)
History and Physical    Tierre Gerard UXL:[id number] DOB: May 14, 1991 DOA: 02/28/2020  PCP: Patient, [name]   Patient coming from: Home   Chief Complaint: abdominal pain   HPI: Nyiesha Beever is a 29 y.o. female with no significant past medical history, who presents with severe abdominal pain and diarrhea.  Patient reports 2 days of persistent generalized abdominal pain, sharp in nature, can get up to a 10/10 out of intensity, worse with bowel movements and touch, associated with watery diarrhea.  Denies any melena or hematemesis.  No vomiting.  Significant decrease in p.o. intake over the last 48 hours.  The appearance of her symptoms were sudden after she woke up.  The day after her symptoms started she went to the emergency department but she left before being seen.  Due to persistent symptoms she return to the emergency department the following day.  Patient denies any sick contacts, no out of the ordinary change in her meal pattern or recent travel.   ED Course: Patient was diagnosed with colitis per CT scan.  She was transferred to [facility] for further management.  Review of Systems:  1. General: No fevers, no chills, no weight gain or weight loss 2. ENT: No runny nose or sore throat, no hearing disturbances 3. Pulmonary: No dyspnea, cough, wheezing, or hemoptysis 4. Cardiovascular: No angina, claudication, lower extremity edema, pnd or orthopnea 5. Gastrointestinal: No nausea or vomiting, positive diarrhea and abdominal pain as mentioned in HPI.  6. Hematology: No easy bruisability or frequent infections 7. Urology: No dysuria, hematuria or increased urinary frequency 8. Dermatology: No rashes. 9. Neurology: No seizures or paresthesias 10. Musculoskeletal: No joint pain or deformities  History reviewed. No pertinent past medical history.  History reviewed. No pertinent surgical history.   reports that she has never smoked. She does not have any smokeless tobacco history on  file. She reports that she does not drink alcohol or use drugs.  No Known Allergies  History reviewed. No pertinent family history.   Prior to Admission medications   Medication Sig Start Date End Date Taking? Authorizing Provider  diphenhydrAMINE (BENADRYL) 25 mg capsule Take 1 capsule (25 mg total) by mouth every 6 (six) hours as needed for itching. 12/09/11 12/19/11  Kirichenko, Lahoma Rocker, PA-C  DiphenhydrAMINE HCl (BENADRYL ALLERGY PO) Take 1 tablet by mouth daily as needed. Patient used this medication because she broke out in hives.    [provider]  famotidine (PEPCID) 20 MG tablet Take 1 tablet (20 mg total) by mouth 2 (two) times daily. 12/09/11 12/08/12  Jeannett Senior, PA-C    Physical Exam: Vitals:   02/28/20 2048 02/29/20 0011 02/29/20 0326 02/29/20 0432  BP:  124/82 124/80 (!) 129/58  Pulse:  94 88 66  Resp:  16 16 14   Temp:   99.1 F (37.3 C) 98 F (36.7 C)  TempSrc:   Oral Oral  SpO2:  99% 99% 100%  Weight: 73 kg     Height: 5' (1.524 m)       Vitals:   02/28/20 2048 02/29/20 0011 02/29/20 0326 02/29/20 0432  BP:  124/82 124/80 (!) 129/58  Pulse:  94 88 66  Resp:  16 16 14   Temp:   99.1 F (37.3 C) 98 F (36.7 C)  TempSrc:   Oral Oral  SpO2:  99% 99% 100%  Weight: 73 kg     Height: 5' (1.524 m)      General: Not in pain or dyspnea Neurology: Awake  and alert, non focal Head and Neck. Head normocephalic. Neck supple with no adenopathy or thyromegaly.   E ENT: no pallor, no icterus, oral mucosa moist Cardiovascular: No JVD. S1-S2 present, rhythmic, no gallops, rubs, or murmurs. No lower extremity edema. Pulmonary: positive breath sounds bilaterally, adequate air movement, no wheezing, rhonchi or rales. Gastrointestinal. Abdomen mild distended but soft, no organomegaly, non tender to superficial palpation , no rebound or guarding Skin. No rashes Musculoskeletal: no joint deformities    Labs on Admission: I have personally reviewed following  labs and imaging studies  CBC: Recent Labs  Lab 02/27/20 1244 02/28/20 2152  WBC 17.0* 12.8*  NEUTROABS 14.0* 8.6*  HGB 12.9 13.2  HCT 38.4 38.2  MCV 96.5 95.5  PLT 347 322   Basic Metabolic Panel: Recent Labs  Lab 02/27/20 1244 02/28/20 2152  NA 137 136  K 3.8 3.8  CL 104 103  CO2 22 24  GLUCOSE 101* 99  BUN 6 10  CREATININE 0.67 1.11*  CALCIUM 9.1 9.2   GFR: Estimated Creatinine Clearance: 66.7 mL/min (A) (by C-G formula based on SCr of 1.11 mg/dL (H)). Liver Function Tests: Recent Labs  Lab 02/27/20 1244 02/28/20 2152  AST 17 15  ALT 19 17  ALKPHOS 52 50  BILITOT 0.6 0.4  PROT 7.2 7.6  ALBUMIN 3.8 4.0   Recent Labs  Lab 02/27/20 1244 02/28/20 2152  LIPASE 17 21   No results for input(s): AMMONIA in the last 168 hours. Coagulation Profile: No results for input(s): INR, PROTIME in the last 168 hours. Cardiac Enzymes: No results for input(s): CKTOTAL, CKMB, CKMBINDEX, TROPONINI in the last 168 hours. BNP (last 3 results) No results for input(s): PROBNP in the last 8760 hours. HbA1C: No results for input(s): HGBA1C in the last 72 hours. CBG: No results for input(s): GLUCAP in the last 168 hours. Lipid Profile: No results for input(s): CHOL, HDL, LDLCALC, TRIG, CHOLHDL, LDLDIRECT in the last 72 hours. Thyroid Function Tests: No results for input(s): TSH, T4TOTAL, FREET4, T3FREE, THYROIDAB in the last 72 hours. Anemia Panel: No results for input(s): VITAMINB12, FOLATE, FERRITIN, TIBC, IRON, RETICCTPCT in the last 72 hours. Urine analysis:    Component Value Date/Time   COLORURINE YELLOW 02/28/2020 2152   APPEARANCEUR CLEAR 02/28/2020 2152   LABSPEC >1.030 (H) 02/28/2020 2152   PHURINE 6.0 02/28/2020 2152   GLUCOSEU NEGATIVE 02/28/2020 2152   HGBUR MODERATE (A) 02/28/2020 2152   BILIRUBINUR SMALL (A) 02/28/2020 2152   KETONESUR 40 (A) 02/28/2020 2152   PROTEINUR NEGATIVE 02/28/2020 2152   UROBILINOGEN 1.0 12/23/2011 1545   NITRITE NEGATIVE  02/28/2020 2152   LEUKOCYTESUR TRACE (A) 02/28/2020 2152    Radiological Exams on Admission: CT ABDOMEN PELVIS W CONTRAST  Result Date: 02/29/2020 CLINICAL DATA:  Lower abdominal pain and diarrhea. Pelvic pain. EXAM: CT ABDOMEN AND PELVIS WITH CONTRAST TECHNIQUE: Multidetector CT imaging of the abdomen and pelvis was performed using the standard protocol following bolus administration of intravenous contrast. CONTRAST:  OMNIPAQUE IOHEXOL 300 MG/ML  SOLN COMPARISON:  Feb 22, 2012. FINDINGS: Lower chest: The lung bases are clear. The heart size is normal. Hepatobiliary: The liver is normal. Normal gallbladder.There is no biliary ductal dilation. Pancreas: Normal contours without ductal dilatation. No peripancreatic fluid collection. Spleen: Unremarkable. Adrenals/Urinary Tract: --Adrenal glands: Unremarkable. --Right kidney/ureter: No hydronephrosis or radiopaque kidney stones. --Left kidney/ureter: No hydronephrosis or radiopaque kidney stones. --Urinary bladder: Unremarkable. Stomach/Bowel: --Stomach/Duodenum: No hiatal hernia or other gastric abnormality. Normal duodenal course and caliber. --Small  bowel: Unremarkable. --Colon: There is diffuse sigmoid and rectal wall thickening with adjacent inflammatory changes and free fluid in the patient's pelvis. There is some mild wall thickening of the transverse colon and descending colon. There is a moderate amount of stool in the ascending colon. --Appendix: Normal. Vascular/Lymphatic: Normal course and caliber of the major abdominal vessels. --No retroperitoneal lymphadenopathy. --No mesenteric lymphadenopathy. --No pelvic or inguinal lymphadenopathy. Reproductive: There is a 3.7 cm fibroid arising uterus. There is an IUD in place. The IUD appears to be somewhat malpositioned and low riding in the endometrial canal. Other: There is a small volume of likely reactive free fluid in the patient's pelvis. The abdominal wall is normal. Musculoskeletal. No acute  displaced fractures. IMPRESSION: 1. Diffuse sigmoid and rectal wall thickening with adjacent inflammatory changes and free fluid in the patient's pelvis, consistent with infectious or inflammatory colitis. 2. An IUD is noted and appears to be somewhat low riding in the endometrial canal. This can be further evaluated with a nonemergent outpatient ultrasound. 3. Fibroid uterus. 4. Normal appendix. Electronically Signed   By: Katherine Mantle M.D.   On: 02/29/2020 01:16    EKG: Independently reviewed. NA   Assessment/Plan Principal Problem:   Colitis Active Problems:   AKI (acute kidney injury) [facility])   Dehydration   29 year old female with no significant past medical history who presents with severe abdominal pain for the last 48 hours, associated with watery diarrhea, no melena or hematemesis.  On her initial physical examination temperature 98, blood pressure 129/58, heart rate 66, respiratory rate 14, oxygen saturation 100% on room air.  Her lungs are clear to auscultation bilaterally, heart S1-S2 present rhythmic, her abdomen is soft, nontender to superficial palpation, no lower extremity edema. Sodium 136, potassium 3.8, chloride 103, bicarb 24, glucose 99, BUN 10, creatinine 1.11, lipase 21, AST 15, ALT 17, white count 12.8, hemoglobin 13.2, hematocrit 38.2, platelets 322.  SARS COVID-19 negative.  Urinalysis specific gravity more than 1.030, 0-5 white cells, 6-10 red cells. CT of the abdomen and pelvis with diffuse sigmoid and rectal wall thickening with adjacent inflammatory changes and free fluid in the patient's pelvis consistent with infectious or inflammatory colitis.  1.  Acute colitis.  Patient has been admitted to the medical ward, will continue supportive medical therapy with intravenous fluids (balanced electrolyte solutions with dextrose), as needed IV analgesics and oral antiacids.   Further work-up with stool panel looking for infectious process.  For now we will start patient  on Zosyn for antibiotic therapy.  Considering patient's age, no sick contacts, if infection is ruled out she may need further work-up with flexible sigmoidoscopy.  2.  Acute kidney injury.  Renal function with a serum creatinine of 0.53, potassium 3.9 and serum bicarb 22.  Will continue hydration with balance electrolyte solutions, with dextrose at 75 mill per hour.  Follow-up kidney function in the morning.  Status is: Observation  The patient remains OBS appropriate and will d/c before 2 midnights.  Dispo: The patient is from: Home              Anticipated d/c is to: Home              Anticipated d/c date is: 1 day              Patient currently is not medically stable to d/c.   DVT prophylaxis: Enoxaparin   Code Status:   full  Family Communication:  No family at the bedside  Consults called:  None   Admission status:  Observation    Janelis Stelzer Annett Gula MD [facility]   02/29/2020, 7:00 AM

## 2020-02-29 NOTE — Progress Notes (Signed)
Pharmacy Antibiotic Note  Loretta Larson is a 29 y.o. female admitted on 02/28/2020 with intraabdominal infection.  Pharmacy has been consulted for Zosyn dosing.  Plan: Zosyn 3.375g IV q8h (4 hour infusion).  - F/U renal funciton, GI panel, plans for antibiotics   Height: 5' (152.4 cm) Weight: 73 kg (161 lb) IBW/kg (Calculated) : 45.5  Temp (24hrs), Avg:98.8 F (37.1 C), Min:98 F (36.7 C), Max:99.4 F (37.4 C)  Recent Labs  Lab 02/27/20 1244 02/28/20 2152  WBC 17.0* 12.8*  CREATININE 0.67 1.11*    Estimated Creatinine Clearance: 66.7 mL/min (A) (by C-G formula based on SCr of 1.11 mg/dL (H)).    No Known Allergies  Antimicrobials this admission: 5/20 Zosyn >>   Dose adjustments this admission:  Microbiology results: 5/20 COVID: neg GI PCR:  Thank you for allowing pharmacy to be a part of this patient's care.  Luisa Hart D 02/29/2020 7:09 AM

## 2020-03-01 LAB — GASTROINTESTINAL PANEL BY PCR, STOOL (REPLACES STOOL CULTURE)

## 2020-03-01 LAB — BASIC METABOLIC PANEL
Anion gap: 8 (ref 5–15)
BUN: 5 mg/dL — ABNORMAL LOW (ref 6–20)
CO2: 24 mmol/L (ref 22–32)
Calcium: 8.7 mg/dL — ABNORMAL LOW (ref 8.9–10.3)
Chloride: 107 mmol/L (ref 98–111)
Creatinine, Ser: 0.82 mg/dL (ref 0.44–1.00)
GFR calc Af Amer: >60 mL/min (ref >60)
GFR calc non Af Amer: >60 mL/min (ref >60)
Glucose, Bld: 115 mg/dL — ABNORMAL HIGH (ref 70–99)
Potassium: 3.6 mmol/L (ref 3.5–5.1)
Sodium: 139 mmol/L (ref 135–145)

## 2020-03-01 LAB — CBC
HCT: 36.5 % (ref 36.0–46.0)
Hemoglobin: 12.1 g/dL (ref 12.0–15.0)
MCH: 32.2 pg (ref 26.0–34.0)
MCHC: 33.2 g/dL (ref 30.0–36.0)
MCV: 97.1 fL (ref 80.0–100.0)
Platelets: 358 10*3/uL (ref 150–400)
RBC: 3.76 MIL/uL — ABNORMAL LOW (ref 3.87–5.11)
RDW: 12.8 % (ref 11.5–15.5)
WBC: 7.6 10*3/uL (ref 4.0–10.5)
nRBC: 0 % (ref 0.0–0.2)

## 2020-03-01 NOTE — Progress Notes (Signed)
PROGRESS NOTE    Loretta Larson  HCW:[id number] DOB: 1991-07-17 DOA: 02/28/2020 PCP: Patient, [name]    Brief Narrative:  Patient admitted to the hospital with the working diagnosis of sigmoid and rectal colitis.   29 year old female with no significant past medical history who presents with severe abdominal pain for the last 48 hours, associated with watery diarrhea, no melena or hematemesis.  On her initial physical examination temperature 98, blood pressure 129/58, heart rate 66, respiratory rate 14, oxygen saturation 100% on room air.  Her lungs are clear to auscultation bilaterally, heart S1-S2 present rhythmic, her abdomen is soft, nontender to superficial palpation, no lower extremity edema. Sodium 136, potassium 3.8, chloride 103, bicarb 24, glucose 99, BUN 10, creatinine 1.11, lipase 21, AST 15, ALT 17, white count 12.8, hemoglobin 13.2, hematocrit 38.2, platelets 322.  SARS COVID-19 negative.  Urinalysis specific gravity more than 1.030, 0-5 white cells, 6-10 red cells. CT of the abdomen and pelvis with diffuse sigmoid and rectal wall thickening with adjacent inflammatory changes and free fluid in the patient's pelvis consistent with infectious or inflammatory colitis.  Patient has been placed on supportive medical therapy, IV fluids and IV antibiotics with improvement in her symptoms.   Assessment & Plan:   Principal Problem:   Colitis Active Problems:   AKI (acute kidney injury) (HCC)   Dehydration   1. Acute colitis (sigmoid and rectal). Patient with improvement in her abdominal pain, positive bowel movement but not diarrhea, no nausea or vomiting. Wbc is down to 7.6. Pending stool studies.   Will continue supportive medical therapy, will advance diet to regular, continue antiacid therapy and as needed analgesics. Will continue antiacid therapy and as needed antiemetics. Out of bed and encourage ambulation.   If stool studies negative and patient continue to improve on  abdominal symptoms, plan for follow up as outpatient.   2. AKI. Stable renal function with serum cr at 0,82 with K at 3,6 and serum bicarbonate at 24.   Will advance diet to regular and will follow renal function and electrolytes in am.   3. New mild skin rash. Reported per nursing, will add as needed benadryl for now.   Status is: Inpatient  Remains inpatient appropriate because:IV treatments appropriate due to intensity of illness or inability to take PO continue IV antibiotic therapy while pending stool studies.    Dispo: The patient is from: Home              Anticipated d/c is to: Home              Anticipated d/c date is: 1 day              Patient currently is not medically stable to d/c.    DVT prophylaxis:  enoxaparin   Code Status:   full  Family Communication:  No family at the bedside     Subjective: Patient with improvement in her symptoms, no nausea or vomiting, tolerating clear liquids.   Objective: Vitals:   02/29/20 0900 02/29/20 1238 02/29/20 2144 03/01/20 0603  BP: 113/64 114/76 119/82 125/77  Pulse: 71 61 62 97  Resp: 15 15 15 15   Temp: 97.9 F (36.6 C) 97.8 F (36.6 C) 99.1 F (37.3 C) 98 F (36.7 C)  TempSrc: Oral Oral Oral Oral  SpO2: 99% 99% 98% 97%  Weight:      Height:       No intake or output data in the 24 hours ending 03/01/20 1340  Filed Weights   02/28/20 2048  Weight: 73 kg    Examination:   General: Not in pain or dyspnea, deconditioned  Neurology: Awake and alert, non focal  E ENT: mild pallor, no icterus, oral mucosa moist Cardiovascular: No JVD. S1-S2 present, rhythmic, no gallops, rubs, or murmurs. No lower extremity edema. Pulmonary: positive breath sounds bilaterally, adequate air movement, no wheezing, rhonchi or rales. Gastrointestinal. Abdomen soft with no organomegaly, non tender, no rebound or guarding Skin. No rashes Musculoskeletal: no joint deformities     Data Reviewed: I have personally reviewed following  labs and imaging studies  CBC: Recent Labs  Lab 02/27/20 1244 02/28/20 2152 02/29/20 0718 03/01/20 0530  WBC 17.0* 12.8* 11.3* 7.6  NEUTROABS 14.0* 8.6*  --   --   HGB 12.9 13.2 12.6 12.1  HCT 38.4 38.2 38.3 36.5  MCV 96.5 95.5 96.7 97.1  PLT 347 322 367 696   Basic Metabolic Panel: Recent Labs  Lab 02/27/20 1244 02/28/20 2152 02/29/20 0718 03/01/20 0530  NA 137 136 137 139  K 3.8 3.8 3.9 3.6  CL 104 103 104 107  CO2 22 24 22 24   GLUCOSE 101* 99 97 115*  BUN 6 10 7  5*  CREATININE 0.67 1.11* 0.53 0.82  CALCIUM 9.1 9.2 8.8* 8.7*   GFR: Estimated Creatinine Clearance: 90.3 mL/min (by C-G formula based on SCr of 0.82 mg/dL). Liver Function Tests: Recent Labs  Lab 02/27/20 1244 02/28/20 2152  AST 17 15  ALT 19 17  ALKPHOS 52 50  BILITOT 0.6 0.4  PROT 7.2 7.6  ALBUMIN 3.8 4.0   Recent Labs  Lab 02/27/20 1244 02/28/20 2152  LIPASE 17 21   No results for input(s): AMMONIA in the last 168 hours. Coagulation Profile: No results for input(s): INR, PROTIME in the last 168 hours. Cardiac Enzymes: No results for input(s): CKTOTAL, CKMB, CKMBINDEX, TROPONINI in the last 168 hours. BNP (last 3 results) No results for input(s): PROBNP in the last 8760 hours. HbA1C: No results for input(s): HGBA1C in the last 72 hours. CBG: No results for input(s): GLUCAP in the last 168 hours. Lipid Profile: No results for input(s): CHOL, HDL, LDLCALC, TRIG, CHOLHDL, LDLDIRECT in the last 72 hours. Thyroid Function Tests: No results for input(s): TSH, T4TOTAL, FREET4, T3FREE, THYROIDAB in the last 72 hours. Anemia Panel: No results for input(s): VITAMINB12, FOLATE, FERRITIN, TIBC, IRON, RETICCTPCT in the last 72 hours.    Radiology Studies: I have reviewed all of the imaging during this hospital visit personally     Scheduled Meds: . enoxaparin (LOVENOX) injection  40 mg Subcutaneous Q24H  . pantoprazole  40 mg Oral Daily  . polyethylene glycol  17 g Oral Daily    Continuous Infusions: . piperacillin-tazobactam (ZOSYN)  IV 3.375 g (03/01/20 1325)     LOS: 1 day        Mauricio Gerome Apley, MD

## 2020-03-01 NOTE — Progress Notes (Signed)
Nurse called patient does not want to wait till morning to be discharged and is signing out [facility]

## 2020-03-02 LAB — CBC WITH DIFFERENTIAL/PLATELET
Abs Immature Granulocytes: 0.02 10*3/uL (ref 0.00–0.07)
Basophils Absolute: 0 10*3/uL (ref 0.0–0.1)
Basophils Relative: 0 %
Eosinophils Absolute: 0.5 10*3/uL (ref 0.0–0.5)
Eosinophils Relative: 8 %
HCT: 39.3 % (ref 36.0–46.0)
Hemoglobin: 12.8 g/dL (ref 12.0–15.0)
Immature Granulocytes: 0 %
Lymphocytes Relative: 30 %
Lymphs Abs: 1.9 10*3/uL (ref 0.7–4.0)
MCH: 32.1 pg (ref 26.0–34.0)
MCHC: 32.6 g/dL (ref 30.0–36.0)
MCV: 98.5 fL (ref 80.0–100.0)
Monocytes Absolute: 0.6 10*3/uL (ref 0.1–1.0)
Monocytes Relative: 9 %
Neutro Abs: 3.2 10*3/uL (ref 1.7–7.7)
Neutrophils Relative %: 53 %
Platelets: 408 10*3/uL — ABNORMAL HIGH (ref 150–400)
RBC: 3.99 MIL/uL (ref 3.87–5.11)
RDW: 12.8 % (ref 11.5–15.5)
WBC: 6.3 10*3/uL (ref 4.0–10.5)
nRBC: 0 % (ref 0.0–0.2)

## 2020-03-02 LAB — BASIC METABOLIC PANEL
Anion gap: 9 (ref 5–15)
BUN: <5 mg/dL — ABNORMAL LOW (ref 6–20)
CO2: 24 mmol/L (ref 22–32)
Calcium: 8.6 mg/dL — ABNORMAL LOW (ref 8.9–10.3)
Chloride: 106 mmol/L (ref 98–111)
Creatinine, Ser: 0.8 mg/dL (ref 0.44–1.00)
GFR calc Af Amer: >60 mL/min (ref >60)
GFR calc non Af Amer: >60 mL/min (ref >60)
Glucose, Bld: 93 mg/dL (ref 70–99)
Potassium: 3.8 mmol/L (ref 3.5–5.1)
Sodium: 139 mmol/L (ref 135–145)

## 2020-03-02 MED ORDER — AMOXICILLIN-POT CLAVULANATE 875-125 MG PO TABS: 1.0000 | ORAL_TABLET | Freq: Two times a day (BID) | ORAL | 0 refills | Status: AC

## 2020-03-02 MED ORDER — AMOXICILLIN-POT CLAVULANATE 875-125 MG PO TABS
1.0000 | ORAL_TABLET | Freq: Two times a day (BID) | ORAL | Status: DC
  Administered 2020-03-02: 1 via ORAL
  Filled 2020-03-02: qty 1

## 2020-03-02 NOTE — Progress Notes (Signed)
Patient informed of consult to Loretta Larson to assist with obtaining a PCP however patient states she does not want to wait and that her ride is waiting, given discharge, follow up, and medication instructions, verbalized understanding, IV removed, personal belongings with patient, family to transport home

## 2020-03-02 NOTE — Discharge Summary (Signed)
Physician Discharge Summary  Loretta Larson POE:[id number] DOB: October 17, 1990 DOA: 02/28/2020  PCP: Patient, [name]  Admit date: 02/28/2020 Discharge date: 03/02/2020  Admitted From: Home  Disposition:  Home   Recommendations for Outpatient Follow-up and new medication changes:  1. Follow up with Primary Care in 7 days.  2. Continue antibiotic therapy with Augmentin for 5 more days. 3. Added pantoprazole 40 mg daily. 4. If recurrent symptoms may need colonoscopy.  Home Health no  Equipment/Devices: no    Discharge Condition: stable  CODE STATUS: full  Diet recommendation: regular.   Brief/Interim Summary: Patient admitted to the hospital with the working diagnosis of sigmoid and rectal colitis.   29 year old female with no significant past medical history who presents with severe abdominal pain for the last 48 hours, associated with watery diarrhea, but no melena or hematemesis. On her initial physical examination temperature 98, blood pressure 129/58,heart rate 66, respiratory rate 14, oxygen saturation 100% on room air. Her lungs were clear to auscultation bilaterally, heart S1-S2 present rhythmic, her abdomen was soft, and nontender to superficial palpation, no lower extremity edema. Sodium 136, potassium 3.8, chloride 103, bicarb 24, glucose 99, BUN 10, creatinine 1.11, lipase 21, AST 15, ALT 17, white count 12.8, hemoglobin 13.2, hematocrit 38.2, platelets 322. SARS COVID-19 negative. Urinalysis specific gravity more than 1.030, 0-5 white cells, 6-10 red cells. CT of the abdomen and pelvis with diffuse sigmoid and rectal wall thickening with adjacent inflammatory changes and free fluid in the patient's pelvis consistent with infectious or inflammatory colitis.  Patient was placed on supportive medical therapy, IV fluids and IV antibiotics with improvement in her symptoms.   1.  Acute colitis, sigmoid and rectal.  Patient was admitted to the medical ward she received supportive  medical therapy with intravenous fluids, IV Zosyn, antiacids, as needed analgesics and antiemetics.  Further work-up with GI stool panel was negative.  Her symptoms improved and her diet was advanced with good toleration.  Her discharge white cell count 6.3.  Patient will continue antibiotic therapy with Augmentin for 5 more days.  She was advised that if symptoms reoccur she will need further work-up with colonoscopy.    2.  Acute kidney injury.  Patient's kidney function improved, patient's diet was advanced with good toleration.  Her discharge potassium 3.8, bicarb 24, sodium 139, creatinine 0.8.  3. Skin rash. Resolved.      Discharge Diagnoses:  Principal Problem:   Colitis Active Problems:   AKI (acute kidney injury) (HCC)   Dehydration    Discharge Instructions   Allergies as of 03/02/2020   No Known Allergies     Medication List    STOP taking these medications   famotidine 20 MG tablet Commonly known as: PEPCID     TAKE these medications   amoxicillin-clavulanate 875-125 MG tablet Commonly known as: AUGMENTIN Take 1 tablet by mouth every 12 (twelve) hours for 5 days.   BENADRYL ALLERGY PO Take 1 tablet by mouth daily as needed. Patient used this medication because she broke out in hives. What changed: Another medication with the same name was removed. Continue taking this medication, and follow the directions you see here.       No Known Allergies    Procedures/Studies: CT ABDOMEN PELVIS W CONTRAST  Result Date: 02/29/2020 CLINICAL DATA:  Lower abdominal pain and diarrhea. Pelvic pain. EXAM: CT ABDOMEN AND PELVIS WITH CONTRAST TECHNIQUE: Multidetector CT imaging of the abdomen and pelvis was performed using the standard protocol following bolus  administration of intravenous contrast. CONTRAST:  OMNIPAQUE IOHEXOL 300 MG/ML  SOLN COMPARISON:  Feb 22, 2012. FINDINGS: Lower chest: The lung bases are clear. The heart size is normal. Hepatobiliary: The  liver is normal. Normal gallbladder.There is no biliary ductal dilation. Pancreas: Normal contours without ductal dilatation. No peripancreatic fluid collection. Spleen: Unremarkable. Adrenals/Urinary Tract: --Adrenal glands: Unremarkable. --Right kidney/ureter: No hydronephrosis or radiopaque kidney stones. --Left kidney/ureter: No hydronephrosis or radiopaque kidney stones. --Urinary bladder: Unremarkable. Stomach/Bowel: --Stomach/Duodenum: No hiatal hernia or other gastric abnormality. Normal duodenal course and caliber. --Small bowel: Unremarkable. --Colon: There is diffuse sigmoid and rectal wall thickening with adjacent inflammatory changes and free fluid in the patient's pelvis. There is some mild wall thickening of the transverse colon and descending colon. There is a moderate amount of stool in the ascending colon. --Appendix: Normal. Vascular/Lymphatic: Normal course and caliber of the major abdominal vessels. --No retroperitoneal lymphadenopathy. --No mesenteric lymphadenopathy. --No pelvic or inguinal lymphadenopathy. Reproductive: There is a 3.7 cm fibroid arising uterus. There is an IUD in place. The IUD appears to be somewhat malpositioned and low riding in the endometrial canal. Other: There is a small volume of likely reactive free fluid in the patient's pelvis. The abdominal wall is normal. Musculoskeletal. No acute displaced fractures. IMPRESSION: 1. Diffuse sigmoid and rectal wall thickening with adjacent inflammatory changes and free fluid in the patient's pelvis, consistent with infectious or inflammatory colitis. 2. An IUD is noted and appears to be somewhat low riding in the endometrial canal. This can be further evaluated with a nonemergent outpatient ultrasound. 3. Fibroid uterus. 4. Normal appendix. Electronically Signed   By: Katherine Mantle M.D.   On: 02/29/2020 01:16       Subjective: Patient is feeling better, no nausea or vomiting, improved abdominal pain.   Discharge  Exam: Vitals:   03/01/20 2040 03/02/20 0515  BP: 104/72 103/69  Pulse: 82 72  Resp: 18 18  Temp: 98.5 F (36.9 C) 98.2 F (36.8 C)  SpO2: 100% 94%   Vitals:   03/01/20 0603 03/01/20 1413 03/01/20 2040 03/02/20 0515  BP: 125/77 118/78 104/72 103/69  Pulse: 97 77 82 72  Resp: 15 16 18 18   Temp: 98 F (36.7 C) 98 F (36.7 C) 98.5 F (36.9 C) 98.2 F (36.8 C)  TempSrc: Oral Oral Oral Oral  SpO2: 97% 99% 100% 94%  Weight:      Height:        General: Not in pain or dyspnea.  Neurology: Awake and alert, non focal  E ENT: no pallor, no icterus, oral mucosa moist Cardiovascular: No JVD. S1-S2 present, rhythmic, no gallops, rubs, or murmurs. No lower extremity edema. Pulmonary: positive breath sounds bilaterally, adequate air movement, no wheezing, rhonchi or rales. Gastrointestinal. Abdomen soft with no organomegaly, non tender, no rebound or guarding Skin. No rashes Musculoskeletal: no joint deformities   The results of significant diagnostics from this hospitalization (including imaging, microbiology, ancillary and laboratory) are listed below for reference.     Microbiology: Recent Results (from the past 240 hour(s))  SARS Coronavirus 2 by RT PCR (hospital order, performed in [facility] hospital lab) Nasopharyngeal Nasopharyngeal Swab     Status: None   Collection Time: 02/29/20  3:22 AM   Specimen: Nasopharyngeal Swab  Result Value Ref Range Status   SARS Coronavirus 2 NEGATIVE NEGATIVE Final    Comment: (NOTE) SARS-CoV-2 target nucleic acids are NOT DETECTED. The SARS-CoV-2 RNA is generally detectable in upper and lower respiratory specimens during  the acute phase of infection. The lowest concentration of SARS-CoV-2 viral copies this assay can detect is 250 copies / mL. A negative result does not preclude SARS-CoV-2 infection and should not be used as the sole basis for treatment or other patient management decisions.  A negative result may occur with improper  specimen collection / handling, submission of specimen other than nasopharyngeal swab, presence of viral mutation(s) within the areas targeted by this assay, and inadequate number of viral copies (<250 copies / mL). A negative result must be combined with clinical observations, patient history, and epidemiological information. Fact Sheet for Patients:   BoilerBrush.com.cy Fact Sheet for Healthcare Providers: https://pope.com/ This test is not yet approved or cleared  by the [place] FDA and has been authorized for detection and/or diagnosis of SARS-CoV-2 by FDA under an Emergency Use Authorization (EUA).  This EUA will remain in effect (meaning this test can be used) for the duration of the COVID-19 declaration under Section 564(b)(1) of the Act, 21 U.S.C. section 360bbb-3(b)(1), unless the authorization is terminated or revoked sooner. Performed at [facility], [address] [place], [place] [address]   Gastrointestinal Panel by PCR , Stool     Status: None   Collection Time: 03/01/20 11:58 AM  Result Value Ref Range Status   Campylobacter species NOT DETECTED NOT DETECTED Final   Plesimonas shigelloides NOT DETECTED NOT DETECTED Final   Salmonella species NOT DETECTED NOT DETECTED Final   Yersinia enterocolitica NOT DETECTED NOT DETECTED Final   Vibrio species NOT DETECTED NOT DETECTED Final   Vibrio cholerae NOT DETECTED NOT DETECTED Final   Enteroaggregative E coli (EAEC) NOT DETECTED NOT DETECTED Final   Enteropathogenic E coli (EPEC) NOT DETECTED NOT DETECTED Final   Enterotoxigenic E coli (ETEC) NOT DETECTED NOT DETECTED Final   Shiga like toxin producing E coli (STEC) NOT DETECTED NOT DETECTED Final   Shigella/Enteroinvasive E coli (EIEC) NOT DETECTED NOT DETECTED Final   Cryptosporidium NOT DETECTED NOT DETECTED Final   Cyclospora cayetanensis NOT DETECTED NOT DETECTED Final   Entamoeba histolytica NOT  DETECTED NOT DETECTED Final   Giardia lamblia NOT DETECTED NOT DETECTED Final   Adenovirus F40/41 NOT DETECTED NOT DETECTED Final   Astrovirus NOT DETECTED NOT DETECTED Final   Norovirus GI/GII NOT DETECTED NOT DETECTED Final   Rotavirus A NOT DETECTED NOT DETECTED Final   Sapovirus (I, II, IV, and V) NOT DETECTED NOT DETECTED Final    Comment: Performed at [facility], [address] [place], [place] [address]     Labs: BNP (last 3 results) No results for input(s): BNP in the last 8760 hours. Basic Metabolic Panel: Recent Labs  Lab 02/27/20 1244 02/28/20 2152 02/29/20 0718 03/01/20 0530 03/02/20 0520  NA 137 136 137 139 139  K 3.8 3.8 3.9 3.6 3.8  CL 104 103 104 107 106  CO2 22 24 22 24 24   GLUCOSE 101* 99 97 115* 93  BUN 6 10 7  5* <5*  CREATININE 0.67 1.11* 0.53 0.82 0.80  CALCIUM 9.1 9.2 8.8* 8.7* 8.6*   Liver Function Tests: Recent Labs  Lab 02/27/20 1244 02/28/20 2152  AST 17 15  ALT 19 17  ALKPHOS 52 50  BILITOT 0.6 0.4  PROT 7.2 7.6  ALBUMIN 3.8 4.0   Recent Labs  Lab 02/27/20 1244 02/28/20 2152  LIPASE 17 21   No results for input(s): AMMONIA in the last 168 hours. CBC: Recent Labs  Lab 02/27/20 1244 02/28/20 2152  02/29/20 0718 03/01/20 0530 03/02/20 0520  WBC 17.0* 12.8* 11.3* 7.6 6.3  NEUTROABS 14.0* 8.6*  --   --  3.2  HGB 12.9 13.2 12.6 12.1 12.8  HCT 38.4 38.2 38.3 36.5 39.3  MCV 96.5 95.5 96.7 97.1 98.5  PLT 347 322 367 358 408*   Cardiac Enzymes: No results for input(s): CKTOTAL, CKMB, CKMBINDEX, TROPONINI in the last 168 hours. BNP: Invalid input(s): POCBNP CBG: No results for input(s): GLUCAP in the last 168 hours. D-Dimer No results for input(s): DDIMER in the last 72 hours. Hgb A1c No results for input(s): HGBA1C in the last 72 hours. Lipid Profile No results for input(s): CHOL, HDL, LDLCALC, TRIG, CHOLHDL, LDLDIRECT in the last 72 hours. Thyroid function studies No results for input(s): TSH, T4TOTAL, T3FREE,  THYROIDAB in the last 72 hours.  Invalid input(s): FREET3 Anemia work up No results for input(s): VITAMINB12, FOLATE, FERRITIN, TIBC, IRON, RETICCTPCT in the last 72 hours. Urinalysis    Component Value Date/Time   COLORURINE YELLOW 02/28/2020 2152   APPEARANCEUR CLEAR 02/28/2020 2152   LABSPEC >1.030 (H) 02/28/2020 2152   PHURINE 6.0 02/28/2020 2152   GLUCOSEU NEGATIVE 02/28/2020 2152   HGBUR MODERATE (A) 02/28/2020 2152   BILIRUBINUR SMALL (A) 02/28/2020 2152   KETONESUR 40 (A) 02/28/2020 2152   PROTEINUR NEGATIVE 02/28/2020 2152   UROBILINOGEN 1.0 12/23/2011 1545   NITRITE NEGATIVE 02/28/2020 2152   LEUKOCYTESUR TRACE (A) 02/28/2020 2152   Sepsis Labs Invalid input(s): PROCALCITONIN,  WBC,  LACTICIDVEN Microbiology Recent Results (from the past 240 hour(s))  SARS Coronavirus 2 by RT PCR (hospital order, performed in [facility] hospital lab) Nasopharyngeal Nasopharyngeal Swab     Status: None   Collection Time: 02/29/20  3:22 AM   Specimen: Nasopharyngeal Swab  Result Value Ref Range Status   SARS Coronavirus 2 NEGATIVE NEGATIVE Final    Comment: (NOTE) SARS-CoV-2 target nucleic acids are NOT DETECTED. The SARS-CoV-2 RNA is generally detectable in upper and lower respiratory specimens during the acute phase of infection. The lowest concentration of SARS-CoV-2 viral copies this assay can detect is 250 copies / mL. A negative result does not preclude SARS-CoV-2 infection and should not be used as the sole basis for treatment or other patient management decisions.  A negative result may occur with improper specimen collection / handling, submission of specimen other than nasopharyngeal swab, presence of viral mutation(s) within the areas targeted by this assay, and inadequate number of viral copies (<250 copies / mL). A negative result must be combined with clinical observations, patient history, and epidemiological information. Fact Sheet for Patients:    StrictlyIdeas.no Fact Sheet for Healthcare Providers: BankingDealers.co.za This test is not yet approved or cleared  by the [place] FDA and has been authorized for detection and/or diagnosis of SARS-CoV-2 by FDA under an Emergency Use Authorization (EUA).  This EUA will remain in effect (meaning this test can be used) for the duration of the COVID-19 declaration under Section 564(b)(1) of the Act, 21 U.S.C. section 360bbb-3(b)(1), unless the authorization is terminated or revoked sooner. Performed at [facility], [address] [place], [place] [address]   Gastrointestinal Panel by PCR , Stool     Status: None   Collection Time: 03/01/20 11:58 AM  Result Value Ref Range Status   Campylobacter species NOT DETECTED NOT DETECTED Final   Plesimonas shigelloides NOT DETECTED NOT DETECTED Final   Salmonella species NOT DETECTED NOT DETECTED Final   Yersinia enterocolitica NOT DETECTED NOT  DETECTED Final   Vibrio species NOT DETECTED NOT DETECTED Final   Vibrio cholerae NOT DETECTED NOT DETECTED Final   Enteroaggregative E coli (EAEC) NOT DETECTED NOT DETECTED Final   Enteropathogenic E coli (EPEC) NOT DETECTED NOT DETECTED Final   Enterotoxigenic E coli (ETEC) NOT DETECTED NOT DETECTED Final   Shiga like toxin producing E coli (STEC) NOT DETECTED NOT DETECTED Final   Shigella/Enteroinvasive E coli (EIEC) NOT DETECTED NOT DETECTED Final   Cryptosporidium NOT DETECTED NOT DETECTED Final   Cyclospora cayetanensis NOT DETECTED NOT DETECTED Final   Entamoeba histolytica NOT DETECTED NOT DETECTED Final   Giardia lamblia NOT DETECTED NOT DETECTED Final   Adenovirus F40/41 NOT DETECTED NOT DETECTED Final   Astrovirus NOT DETECTED NOT DETECTED Final   Norovirus GI/GII NOT DETECTED NOT DETECTED Final   Rotavirus A NOT DETECTED NOT DETECTED Final   Sapovirus (I, II, IV, and V) NOT DETECTED NOT DETECTED Final    Comment: Performed  at [facility], [address] [place], [place] [id number]     Time coordinating discharge: 45 minutes  SIGNED:   Coralie KeensMauricio Daniel Borna Wessinger, MD  [facility] 03/02/2020, 10:13 AM

## 2020-03-02 NOTE — Plan of Care (Signed)

## 2020-06-05 ENCOUNTER — Other Ambulatory Visit: Payer: Self-pay

## 2020-06-05 DIAGNOSIS — Z20822 Contact with and (suspected) exposure to covid-19: Secondary | ICD-10-CM

## 2020-06-06 ENCOUNTER — Other Ambulatory Visit: Payer: Self-pay

## 2020-06-07 LAB — NOVEL CORONAVIRUS, NAA: SARS-CoV-2, NAA: DETECTED — AB

## 2020-06-07 LAB — SARS-COV-2, NAA 2 DAY TAT

## 2021-04-07 IMAGING — CT CT ABD-PELV W/ CM
2 of 4 series · 16 of 46 positions shown, 18 images · IV contrast (Omnipaque)
Comparison: February 22, 2012.

CLINICAL DATA: Lower abdominal pain and diarrhea. Pelvic pain.

EXAM:
CT ABDOMEN AND PELVIS WITH CONTRAST
TECHNIQUE: Multidetector CT imaging of the abdomen and pelvis was performed
using the standard protocol following bolus administration of
intravenous contrast.
CONTRAST:  100mL OMNIPAQUE IOHEXOL 300 MG/ML  SOLN

[Series 2: axial st · axial · 0.67mm/px · z∈[-454,-109]mm · 13 of 77 slices shown, 15 images]
[im 4/77  soft-tissue]
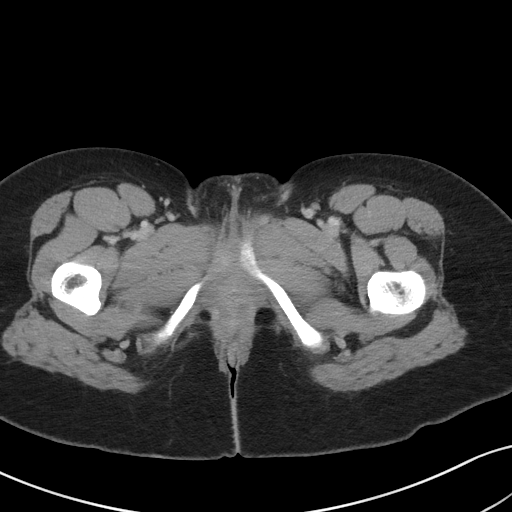
[im 4/77  bone]
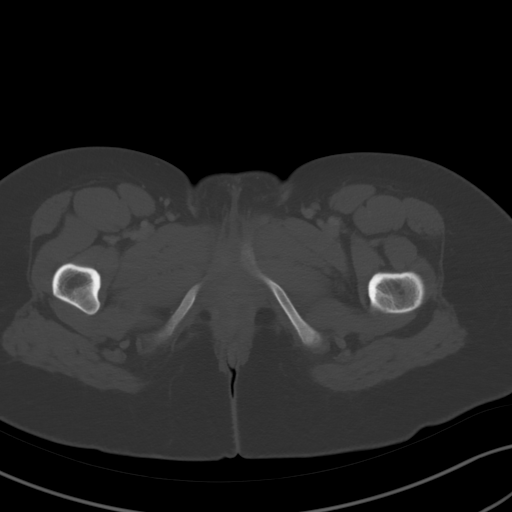
[im 10/77  soft-tissue]
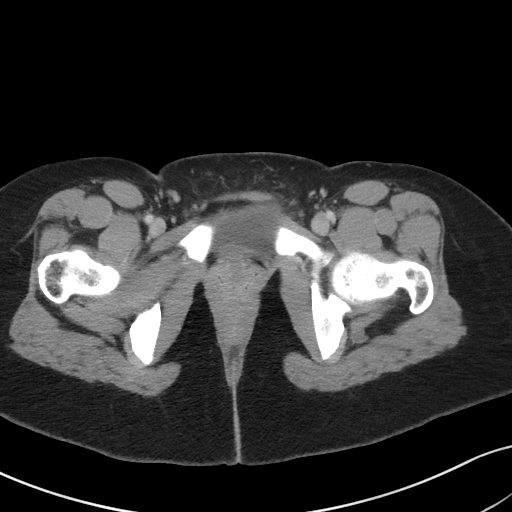
[im 17/77  soft-tissue]
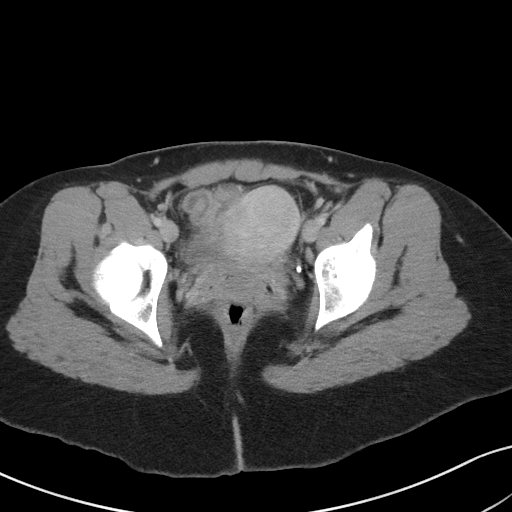
[im 20/77  soft-tissue]
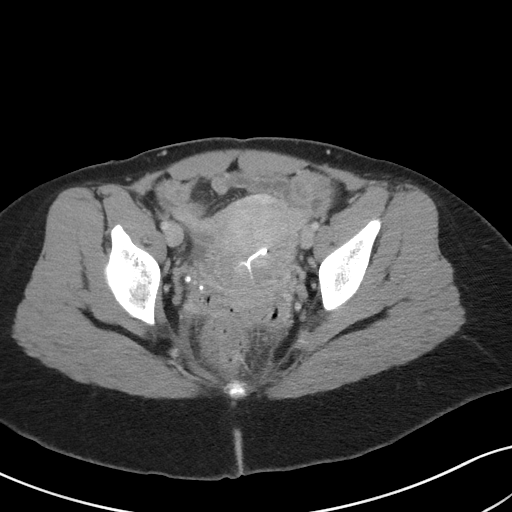
[im 27/77  soft-tissue]
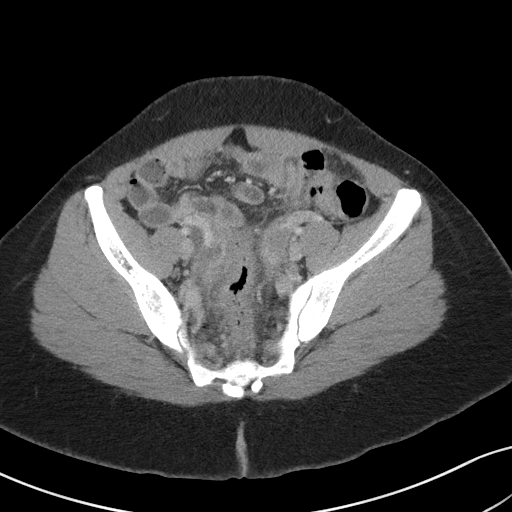
[im 34/77  soft-tissue]
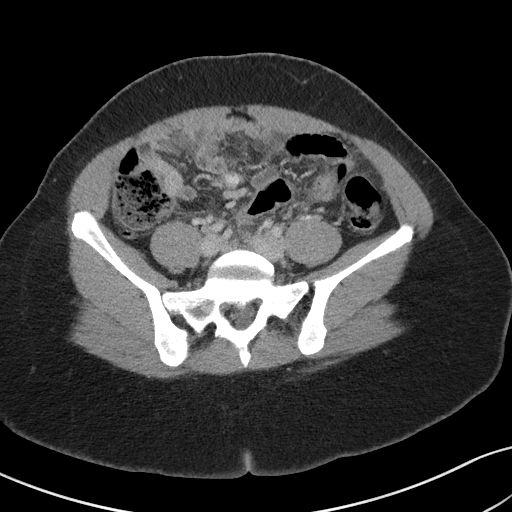
[im 40/77  soft-tissue]
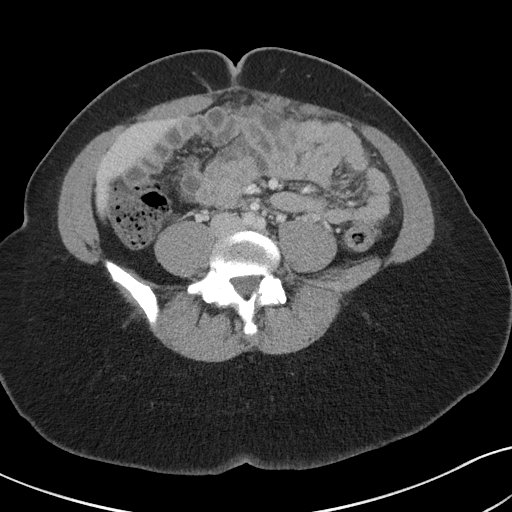
[im 43/77  soft-tissue]
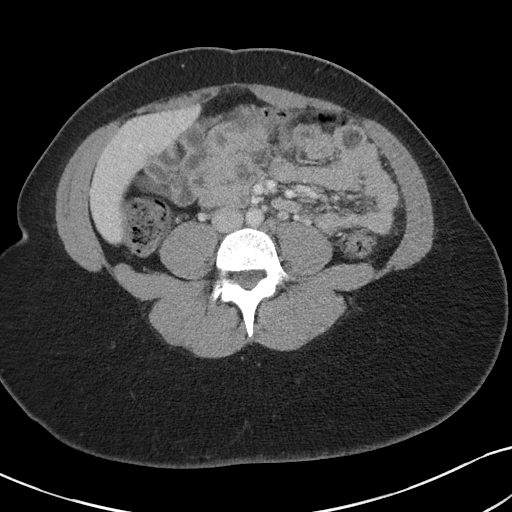
[im 50/77  soft-tissue]
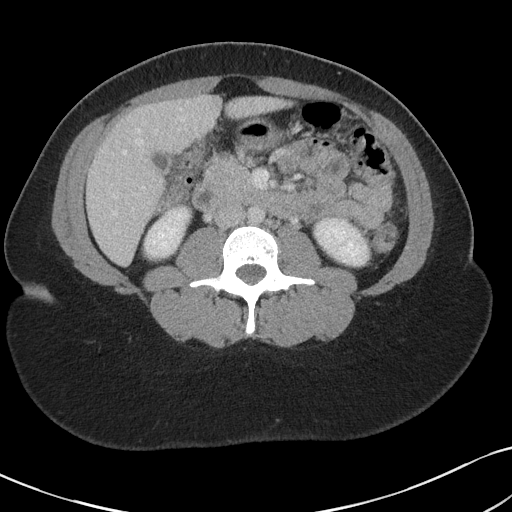
[im 50/77  bone]
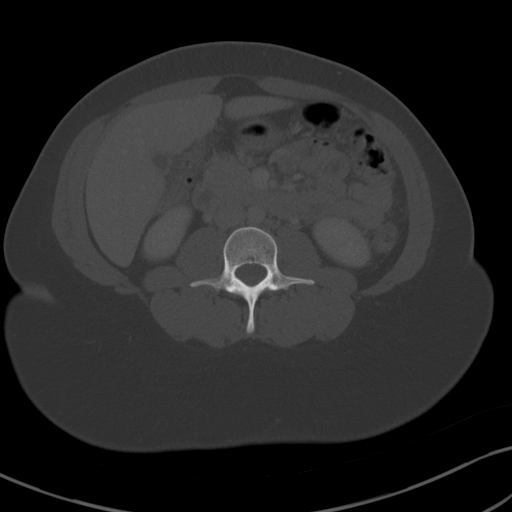
[im 57/77  soft-tissue]
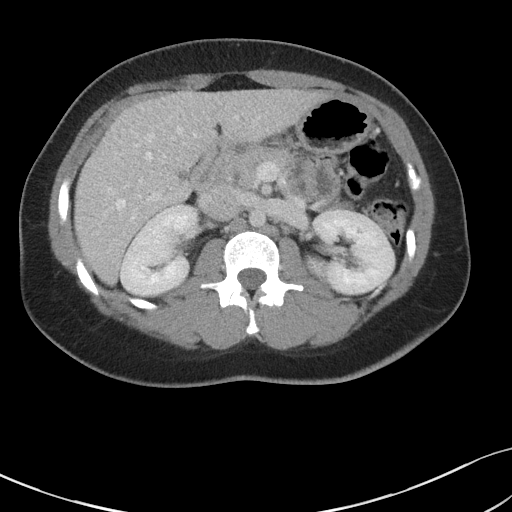
[im 60/77  soft-tissue]
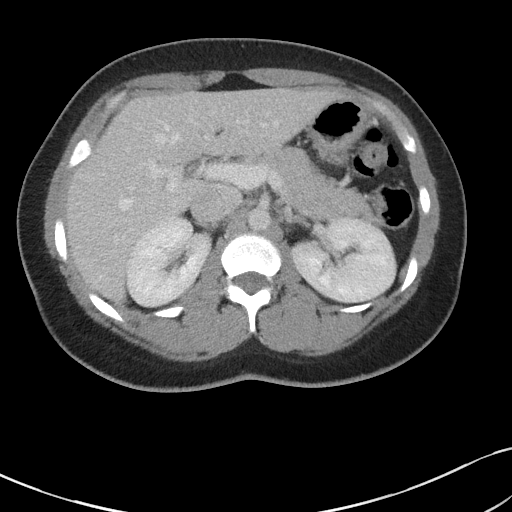
[im 67/77  soft-tissue]
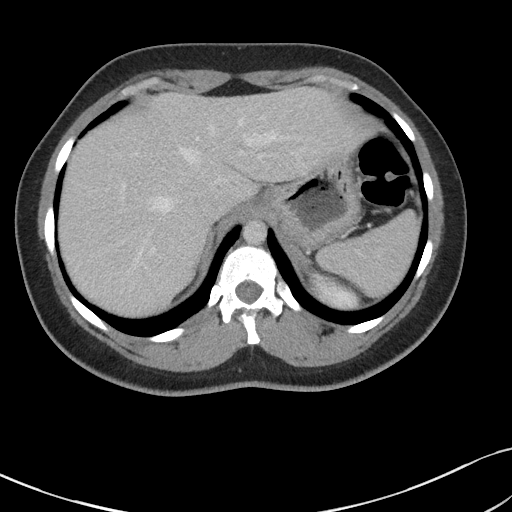
[im 73/77  soft-tissue]
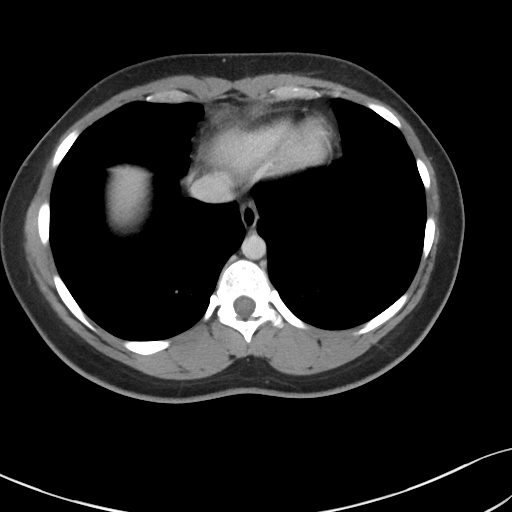

[Series 5: coronal st · coronal · 0.65mm/px · 3 of 101 slices shown]
[im 34/101  soft-tissue]
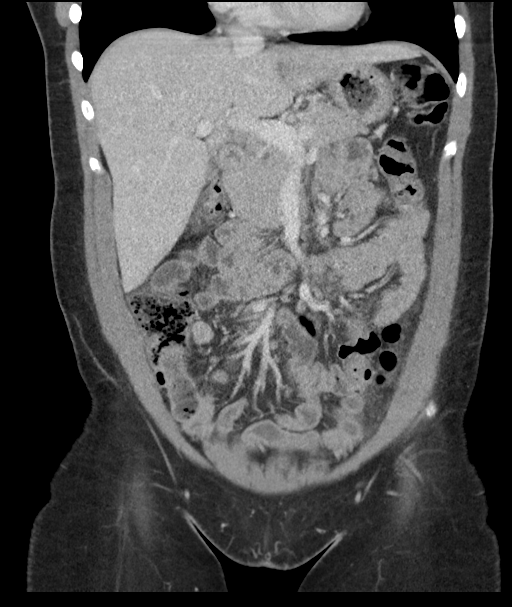
[im 45/101  soft-tissue]
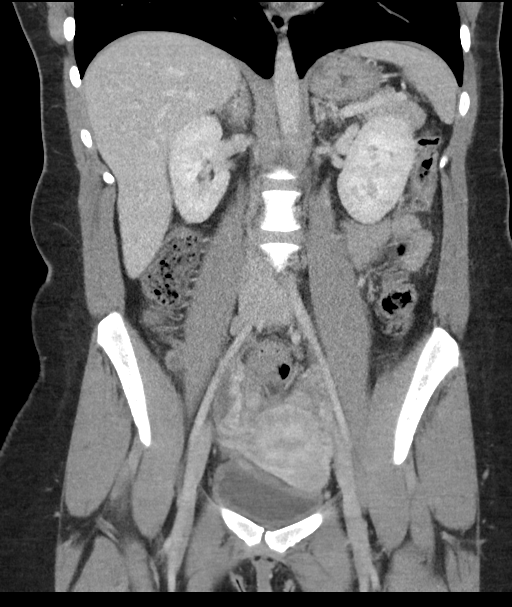
[im 56/101  soft-tissue]
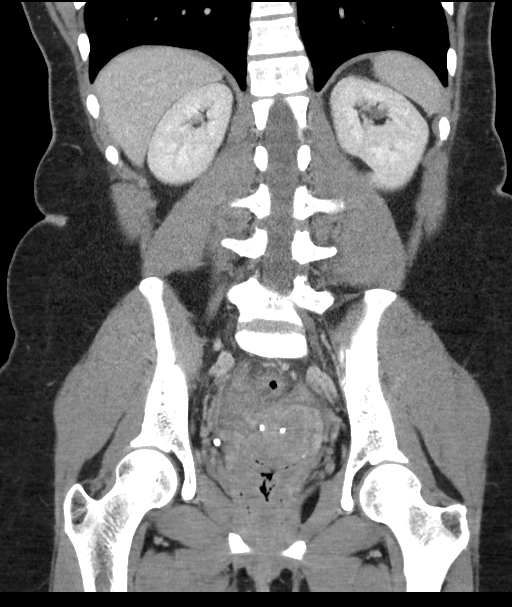

[16 of 46 positions shown; findings below may reference images not displayed]

FINDINGS: Lower chest: The lung bases are clear. The heart size is normal.

Hepatobiliary: The liver is normal. Normal gallbladder.There is no
biliary ductal dilation.

Pancreas: Normal contours without ductal dilatation. No
peripancreatic fluid collection.

Spleen: Unremarkable.

Adrenals/Urinary Tract:

--Adrenal glands: Unremarkable.

--Right kidney/ureter: No hydronephrosis or radiopaque kidney
stones.

--Left kidney/ureter: No hydronephrosis or radiopaque kidney stones.

--Urinary bladder: Unremarkable.

Stomach/Bowel:

--Stomach/Duodenum: No hiatal hernia or other gastric abnormality.
Normal duodenal course and caliber.

--Small bowel: Unremarkable.

--Colon: There is diffuse sigmoid and rectal wall thickening with
adjacent inflammatory changes and free fluid in the patient's
pelvis. There is some mild wall thickening of the transverse colon
and descending colon. There is a moderate amount of stool in the
ascending colon.

--Appendix: Normal.

Vascular/Lymphatic: Normal course and caliber of the major abdominal
vessels.

--No retroperitoneal lymphadenopathy.

--No mesenteric lymphadenopathy.

--No pelvic or inguinal lymphadenopathy.

Reproductive: There is a 3.7 cm fibroid arising uterus. There is an
IUD in place. The IUD appears to be somewhat malpositioned and low
riding in the endometrial canal.

Other: There is a small volume of likely reactive free fluid in the
patient's pelvis. The abdominal wall is normal.

Musculoskeletal. No acute displaced fractures.
IMPRESSION: 1. Diffuse sigmoid and rectal wall thickening with adjacent
inflammatory changes and free fluid in the patient's pelvis,
consistent with infectious or inflammatory colitis.
2. An IUD is noted and appears to be somewhat low riding in the
endometrial canal. This can be further evaluated with a nonemergent
outpatient ultrasound.
3. Fibroid uterus.
4. Normal appendix.

## 2024-07-19 ENCOUNTER — Other Ambulatory Visit: Payer: Self-pay

## 2024-07-19 ENCOUNTER — Ambulatory Visit (INDEPENDENT_AMBULATORY_CARE_PROVIDER_SITE_OTHER): Admitting: Allergy

## 2024-07-19 ENCOUNTER — Encounter: Payer: Self-pay | Admitting: Allergy

## 2024-07-19 VITALS — BP 120/80 | HR 60 | Temp 98.6°F | Ht 60.0 in | Wt 168.1 lb

## 2024-07-19 DIAGNOSIS — H01134 Eczematous dermatitis of left upper eyelid: Secondary | ICD-10-CM

## 2024-07-19 DIAGNOSIS — T783XXD Angioneurotic edema, subsequent encounter: Secondary | ICD-10-CM

## 2024-07-19 DIAGNOSIS — H01135 Eczematous dermatitis of left lower eyelid: Secondary | ICD-10-CM

## 2024-07-19 DIAGNOSIS — J310 Chronic rhinitis: Secondary | ICD-10-CM

## 2024-07-19 DIAGNOSIS — H01131 Eczematous dermatitis of right upper eyelid: Secondary | ICD-10-CM | POA: Diagnosis not present

## 2024-07-19 DIAGNOSIS — H01132 Eczematous dermatitis of right lower eyelid: Secondary | ICD-10-CM

## 2024-07-19 DIAGNOSIS — L500 Allergic urticaria: Secondary | ICD-10-CM

## 2024-07-19 NOTE — Progress Notes (Signed)
 New Patient Note  RE: Loretta Larson MRN: [id number] DOB: 1991-09-29 Date of Office Visit: 07/19/2024  Primary care provider: Durel Riggs, FNP  Chief Complaint: eye swelling  History of present illness: Loretta Larson is a 33 y.o. female presenting today for evaluation of angioedema.  Discussed the use of AI scribe software for clinical note transcription with the patient, who gave verbal consent to proceed.  For the past two months, she has experienced episodes of eyelid swelling, itching, and subsequent flaking of the lids. The swelling occurs after she goes outside, typically becoming noticeable the following morning. The swelling affects both the upper and lower eyelids, and the skin becomes flaky as the swelling subsides. The itching is localized to the skin of the eyelids and does not affect her eyeballs. The episodes are not daily, as she works from home and primarily notices the symptoms after being outside on her day off. She has attempted to manage the symptoms with Benadryl , which provided limited relief, taking two to three days for the swelling to subside. Recently, he saw her PCP regarding this issue and she has been using prednisone , taking one tablet the day before going outside.  This has helped to prevent the symptoms from progressing.  She states she only has 10 tablets.  She has a history of hives, which she attributes to allergens, but the current eyelid swelling is a new symptom for her.   No associated hives or welts with the eyelid swelling. She notes the hives occur if she is outside and pollen gets on her skin.  She has not noticed any food triggers and maintains a consistent diet, primarily consuming chicken and [place] as she does not eat red meat at all.  No swelling or flaking in other areas of the body. No itching of the eyeballs, only the eyelid skin.       Review of systems: 10pt ROS negative unless noted above in HPI   Past medical history: History  reviewed. No pertinent past medical history.  Past surgical history: History reviewed. No pertinent surgical history.  Family history:  Family History  Problem Relation Age of Onset   Allergic rhinitis Father    Allergic rhinitis Paternal Grandmother    Asthma Paternal Grandmother    Allergic rhinitis Paternal Grandfather     Social history: Lives in a mobile home with carpeting in the bedroom.  Electric heating and central cooling in the home.  2 dogs in the home.  No concern for water damage, mildew or roaches in the home.  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  She does report a smoking/vaping history.   Medication List: Current Outpatient Medications  Medication Sig Dispense Refill   EPINEPHrine 0.3 mg/0.3 mL IJ SOAJ injection SMARTSIG:1 auto-injector IM Daily PRN     predniSONE  (DELTASONE ) 20 MG tablet Take 20 mg by mouth daily.     Vitamin D, Ergocalciferol, (DRISDOL) 1.25 MG (50000 UNIT) CAPS capsule Take 50,000 Units by mouth once a week.     No current facility-administered medications for this visit.    Known medication allergies: No Known Allergies   Physical examination: Blood pressure 120/80, pulse 60, temperature 98.6 F (37 C), height 5' (1.524 m), weight 168 lb 1.6 oz (76.2 kg), SpO2 99%.  General: Alert, interactive, in no acute distress. HEENT: PERRLA, lower lid mild edema, TMs pearly gray, turbinates moderately edematous without discharge, post-pharynx non erythematous. Neck: Supple without lymphadenopathy. Lungs: Clear to auscultation without wheezing, rhonchi or  rales. {no increased work of breathing. CV: Normal S1, S2 without murmurs. Abdomen: Nondistended, nontender. Skin: Warm and dry, without lesions or rashes. Extremities:  No clubbing, cyanosis or edema. Neuro:   Grossly intact.  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  Current prednisone  use unsuitable for long-term management. Antihistamines preferred for histamine-driven symptoms. Benadryl  ineffective due to short action. - Initiate Zyrtec 10mg , Allegra 180mg , or Xyzal 5mg  once daily at night.  Increase to 2 tabs daily if needed for additional control.   - Use topical nonsteroidal ointment Elidel or Protopic twice a day as needed for itch, scaling/flaking.  Ok to use anywhere on body including eyelids - Consider adding in Pepcid  for secondary antihistamine coverage if needed.  - Reserve prednisone  for severe episodes only. - Order blood allergy testing to identify environmental allergens as well as rule-out other causes of facial swelling - Provide emergency action plan for EpiPen use.  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 No hives with eyelid swelling. Antihistamines should manage hives. Blood workup needed to rule out autoimmune causes. - Order blood workup for chronic hives and swelling, including autoimmune antibodies and hereditary angioedema panel - Antihistamines as above  Follow-up in 3-4 months or sooner if needed  I appreciate the opportunity to take part in Danyela's care. Please do not hesitate to contact me with questions.=====  Sincerely,   Danita Brain, MD Allergy/Immunology [facility]

## 2024-07-19 NOTE — Patient Instructions (Addendum)
 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  Current prednisone  use unsuitable for long-term management. Antihistamines preferred for histamine-driven symptoms. Benadryl  ineffective due to short action. - Initiate Zyrtec 10mg , Allegra 180mg , or Xyzal 5mg  once daily at night.  Increase to 2 tabs daily if needed for additional control.   - Use topical nonsteroidal ointment Elidel or Protopic twice a day as needed for itch, scaling/flaking.  Ok to use anywhere on body including eyelids - Reserve prednisone  for severe episodes only. - Order blood allergy testing to identify environmental allergens as well as rule-out other causes of facial swelling - Provide emergency action plan for EpiPen use.  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 No hives with eyelid swelling. Antihistamines should manage hives. Blood workup needed to rule out autoimmune causes. - Order blood workup for chronic hives and swelling, including autoimmune antibodies and hereditary angioedema panel - Antihistamines as above  Follow-up in 3-4 months or sooner if needed

## 2024-07-26 LAB — ALLERGENS W/TOTAL IGE AREA 2
Alternaria Alternata IgE: <0.1 kU/L
Aspergillus Fumigatus IgE: <0.1 kU/L
Bermuda Grass IgE: <0.1 kU/L
Cat Dander IgE: <0.1 kU/L
Cedar, Mountain IgE: <0.1 kU/L
Cladosporium Herbarum IgE: <0.1 kU/L
Cockroach, German IgE: 0.19 kU/L — AB
Common Silver Birch IgE: <0.1 kU/L
Cottonwood IgE: <0.1 kU/L
D Farinae IgE: 0.75 kU/L — AB
D Pteronyssinus IgE: 0.4 kU/L — AB
Dog Dander IgE: <0.1 kU/L
Elm, American IgE: <0.1 kU/L
IgE (Immunoglobulin E), Serum: 118 [IU]/mL (ref 6–495)
Johnson Grass IgE: <0.1 kU/L
Maple/Box Elder IgE: <0.1 kU/L
Mouse Urine IgE: <0.1 kU/L
Oak, White IgE: <0.1 kU/L
Pecan, Hickory IgE: <0.1 kU/L
Penicillium Chrysogen IgE: <0.1 kU/L
Pigweed, Rough IgE: <0.1 kU/L
Ragweed, Short IgE: <0.1 kU/L
Sheep Sorrel IgE Qn: <0.1 kU/L
Timothy Grass IgE: <0.1 kU/L
White Mulberry IgE: <0.1 kU/L

## 2024-07-26 LAB — CHRONIC URTICARIA (CU) EVAL
ALT: 14 IU/L (ref 0–32)
Basophils Absolute: 0 x10E3/uL (ref 0.0–0.2)
Basos: 1 %
CRP: <1 mg/L (ref 0–10)
EOS (ABSOLUTE): 0.1 x10E3/uL (ref 0.0–0.4)
Eos: 2 %
Hematocrit: 41.3 % (ref 34.0–46.6)
Hemoglobin: 13.3 g/dL (ref 11.1–15.9)
Immature Grans (Abs): 0 x10E3/uL (ref 0.0–0.1)
Immature Granulocytes: 0 %
Lymphocytes Absolute: 1.9 x10E3/uL (ref 0.7–3.1)
Lymphs: 27 %
MCH: 32 pg (ref 26.6–33.0)
MCHC: 32.2 g/dL (ref 31.5–35.7)
MCV: 100 fL — ABNORMAL HIGH (ref 79–97)
Monocytes Absolute: 0.4 x10E3/uL (ref 0.1–0.9)
Monocytes: 6 %
Neutrophils Absolute: 4.6 x10E3/uL (ref 1.4–7.0)
Neutrophils: 64 %
Platelets: 328 x10E3/uL (ref 150–450)
Pooled Donor- BAT CU: 9.6 % (ref 0.00–10.60)
RBC: 4.15 x10E6/uL (ref 3.77–5.28)
RDW: 12.9 % (ref 11.7–15.4)
Sed Rate: 10 mm/h (ref 0–32)
TSH: 0.654 u[IU]/mL (ref 0.450–4.500)
Thyroperoxidase Ab SerPl-aCnc: <9 [IU]/mL (ref 0–34)
WBC: 7.1 x10E3/uL (ref 3.4–10.8)

## 2024-07-26 LAB — COMPLEMENT COMPONENT C1Q: Complement C1Q: 16 mg/dL (ref 10.3–20.5)

## 2024-07-26 LAB — TRYPTASE: Tryptase: 2.7 ug/L (ref 2.2–13.2)

## 2024-07-26 LAB — C4 COMPLEMENT: Complement C4, Serum: 30 mg/dL (ref 12–38)

## 2024-07-26 LAB — C1 ESTERASE INHIBITOR, FUNCTIONAL: C1INH Functional/C1INH Total MFr SerPl: 96 %{normal}

## 2024-07-26 LAB — C1 ESTERASE INHIBITOR: C1INH SerPl-mCnc: 40 mg/dL — ABNORMAL HIGH (ref 21–39)

## 2024-07-28 ENCOUNTER — Ambulatory Visit: Payer: Self-pay | Admitting: Allergy

## 2024-09-25 ENCOUNTER — Encounter (HOSPITAL_COMMUNITY): Payer: Self-pay

## 2024-09-25 ENCOUNTER — Other Ambulatory Visit: Payer: Self-pay

## 2024-09-25 ENCOUNTER — Emergency Department (HOSPITAL_COMMUNITY)
Admission: EM | Admit: 2024-09-25 | Discharge: 2024-09-25 | Disposition: A | Discharge status: Home / Self Care | Attending: Emergency Medicine | Admitting: Emergency Medicine

## 2024-09-25 DIAGNOSIS — R1030 Lower abdominal pain, unspecified: Secondary | ICD-10-CM | POA: Diagnosis present

## 2024-09-25 DIAGNOSIS — N390 Urinary tract infection, site not specified: Secondary | ICD-10-CM

## 2024-09-25 LAB — URINALYSIS, ROUTINE W REFLEX MICROSCOPIC
Bilirubin Urine: NEGATIVE
Glucose, UA: NEGATIVE mg/dL
Hgb urine dipstick: NEGATIVE
Ketones, ur: NEGATIVE mg/dL
Nitrite: NEGATIVE
Protein, ur: NEGATIVE mg/dL
Specific Gravity, Urine: 1.005 (ref 1.005–1.030)
pH: 6 (ref 5.0–8.0)

## 2024-09-25 MED ORDER — CEPHALEXIN 500 MG PO CAPS: 500.0000 mg | ORAL_CAPSULE | Freq: Four times a day (QID) | ORAL | 0 refills | Status: DC

## 2024-09-25 MED ORDER — CEPHALEXIN 500 MG PO CAPS: 500.0000 mg | ORAL_CAPSULE | Freq: Four times a day (QID) | ORAL | 0 refills | Status: AC

## 2024-09-25 NOTE — ED Notes (Signed)
 PT requesting to leave due to needing to pick up RX.

## 2024-09-25 NOTE — ED Provider Notes (Signed)
 [place] EMERGENCY DEPARTMENT AT [facility] Provider Note   CSN: [id number] Arrival date & time: 09/25/24  1220     Patient presents with: Abdominal Pain   Loretta Larson is a 33 y.o. female.   Patient complains of lower abdominal pain.  Patient thinks that she has a urinary tract infection.  Patient complains of discomfort with urination.  Discussed the patient with Dr. Horton patient complains of frequent urination.  She denies any possibility of pregnancy she has not had any vaginal discharge she has no STD risk.  The history is provided by the patient. No language interpreter was used.  Abdominal Pain      Prior to Admission medications  Medication Sig Start Date End Date Taking? Authorizing Provider  cephALEXin  (KEFLEX ) 500 MG capsule Take 1 capsule (500 mg total) by mouth 4 (four) times daily. 09/25/24  Yes Carren Blakley K, PA-C  EPINEPHrine 0.3 mg/0.3 mL IJ SOAJ injection SMARTSIG:1 auto-injector IM Daily PRN 06/22/24   [provider]  predniSONE  (DELTASONE ) 20 MG tablet Take 20 mg by mouth daily. 07/05/24   [provider]  Vitamin D, Ergocalciferol, (DRISDOL) 1.25 MG (50000 UNIT) CAPS capsule Take 50,000 Units by mouth once a week. 07/12/24   [provider]    Allergies: Patient has no known allergies.    Review of Systems  Gastrointestinal:  Positive for abdominal pain.  All other systems reviewed and are negative.   Updated Vital Signs BP 134/60   Pulse 80   Temp 98 F (36.7 C) (Oral)   Resp 16   Ht 5' (1.524 m)   Wt 76.2 kg   SpO2 96%   BMI 32.81 kg/m   Physical Exam Vitals and nursing note reviewed.  Constitutional:      Appearance: She is well-developed.  HENT:     Head: Normocephalic.  Cardiovascular:     Rate and Rhythm: Normal rate.  Pulmonary:     Effort: Pulmonary effort is normal.  Abdominal:     General: Abdomen is flat. Bowel sounds are normal. There is no distension.     Palpations: Abdomen is  soft.     Tenderness: There is generalized abdominal tenderness and tenderness in the suprapubic area.  Musculoskeletal:        General: Normal range of motion.     Cervical back: Normal range of motion.  Skin:    General: Skin is warm.  Neurological:     General: No focal deficit present.     Mental Status: She is alert and oriented to person, place, and time.  Psychiatric:        Mood and Affect: Mood normal.     (all labs ordered are listed, but only abnormal results are displayed) Labs Reviewed  URINALYSIS, ROUTINE W REFLEX MICROSCOPIC - Abnormal; Notable for the following components:      Result Value   Color, Urine STRAW (*)    Leukocytes,Ua MODERATE (*)    Bacteria, UA RARE (*)    All other components within normal limits  LIPASE, BLOOD  COMPREHENSIVE METABOLIC PANEL WITH GFR  CBC  HCG, SERUM, QUALITATIVE    EKG: None  Radiology: No results found.   Procedures   Medications Ordered in the ED - No data to display                                  Medical Decision Making Complains of  lower abdominal pain frequency and discomfort with urination  Amount and/or Complexity of Data Reviewed Labs: ordered. Decision-making details documented in ED Course.  Risk Prescription drug management. Risk Details: UA shows moderate leukocytes 6-10 white blood cells.  Patient has symptoms of a urinary tract infection.  Patient given a prescription for Keflex .  Patient is advised to follow-up with her primary care physician for recheck.  She is advised to return to the emergency department if symptoms worsen or change. Pt declined blood work         Final diagnoses:  Urinary tract infection without hematuria, site unspecified    ED Discharge Orders          Ordered    cephALEXin  (KEFLEX ) 500 MG capsule  4 times daily        09/25/24 1800            An After Visit Summary was printed and given to the patient.    Flint Sonny POUR, PA-C 09/25/24 MANON     Randol Simmonds, MD 09/27/24 [phone]

## 2024-09-25 NOTE — ED Triage Notes (Signed)
 Lower abdominal pain with nausea, no vomiting, no diarrhea. Pain radiates into lower back. Urinary frequency.
